# Patient Record
Sex: Female | Born: 1950 | Race: White | Marital: Married | Smoking: Never smoker
Health system: Northeastern US, Community
[De-identification: ages and names within clinical notes are randomized; demographics above are authoritative.]

## PROBLEM LIST (undated history)

## (undated) DIAGNOSIS — H8309 Labyrinthitis, unspecified ear: Secondary | ICD-10-CM

## (undated) DIAGNOSIS — I10 Essential (primary) hypertension: Secondary | ICD-10-CM

## (undated) HISTORY — PX: SUPRACERVICAL ABDL HYSTER W/WO RMVL TUBE OVARY: REP154

## (undated) HISTORY — PX: OB ANTEPARTUM CARE CESAREAN DLVR & POSTPARTUM: REP299

## (undated) HISTORY — DX: Essential (primary) hypertension: I10

## (undated) HISTORY — PX: MAMMAPLASTY AUGMENTATION W/PROSTHETIC IMPLANT: REP22

## (undated) HISTORY — DX: Labyrinthitis, unspecified ear: H83.09

---
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
## 2014-11-22 HISTORY — PX: MAMMAPLASTY AUGMENTATION W/PROSTHETIC IMPLANT: REP22

## 2017-12-15 ENCOUNTER — Telehealth (HOSPITAL_BASED_OUTPATIENT_CLINIC_OR_DEPARTMENT_OTHER): Payer: Self-pay | Admitting: Internal Medicine

## 2017-12-15 NOTE — Progress Notes (Unsigned)
nurse from [facility] contacted the [facility] to complete a benefit analysis for the tdap Vaccine.    The vaccine is covered under the patients prescription coverage.    Please choose  (Prior Auth)

## 2017-12-16 ENCOUNTER — Encounter (HOSPITAL_BASED_OUTPATIENT_CLINIC_OR_DEPARTMENT_OTHER): Payer: Self-pay | Admitting: Internal Medicine

## 2017-12-16 ENCOUNTER — Ambulatory Visit (HOSPITAL_BASED_OUTPATIENT_CLINIC_OR_DEPARTMENT_OTHER): Payer: No Typology Code available for payment source | Admitting: Internal Medicine

## 2017-12-16 VITALS — BP 105/69 | HR 86 | Temp 98.9°F | Ht 61.02 in | Wt 135.0 lb

## 2017-12-16 DIAGNOSIS — N951 Menopausal and female climacteric states: Secondary | ICD-10-CM | POA: Diagnosis not present

## 2017-12-16 DIAGNOSIS — I10 Essential (primary) hypertension: Secondary | ICD-10-CM | POA: Diagnosis not present

## 2017-12-16 DIAGNOSIS — Z0000 Encounter for general adult medical examination without abnormal findings: Secondary | ICD-10-CM | POA: Diagnosis not present

## 2017-12-16 DIAGNOSIS — E7800 Pure hypercholesterolemia, unspecified: Secondary | ICD-10-CM | POA: Diagnosis not present

## 2017-12-16 DIAGNOSIS — B009 Herpesviral infection, unspecified: Secondary | ICD-10-CM

## 2017-12-16 DIAGNOSIS — K219 Gastro-esophageal reflux disease without esophagitis: Secondary | ICD-10-CM

## 2017-12-16 DIAGNOSIS — H579 Unspecified disorder of eye and adnexa: Secondary | ICD-10-CM | POA: Diagnosis not present

## 2017-12-16 DIAGNOSIS — Z1231 Encounter for screening mammogram for malignant neoplasm of breast: Secondary | ICD-10-CM | POA: Diagnosis not present

## 2017-12-16 DIAGNOSIS — R0981 Nasal congestion: Secondary | ICD-10-CM | POA: Diagnosis not present

## 2017-12-16 DIAGNOSIS — Z1211 Encounter for screening for malignant neoplasm of colon: Secondary | ICD-10-CM | POA: Diagnosis not present

## 2017-12-16 DIAGNOSIS — Z1239 Encounter for other screening for malignant neoplasm of breast: Secondary | ICD-10-CM

## 2017-12-16 MED ORDER — SIMVASTATIN 20 MG PO TABS
20.0000 mg | ORAL_TABLET | Freq: Every evening | ORAL | 2 refills | Status: DC
Start: 2017-12-16 — End: 2018-06-13

## 2017-12-16 MED ORDER — ATENOLOL 50 MG PO TABS: 50 mg | tablet | Freq: Every day | ORAL | 2 refills | 0 days | Status: DC

## 2017-12-16 MED ORDER — OMEPRAZOLE 20 MG PO CPDR
20.00 mg | DELAYED_RELEASE_CAPSULE | Freq: Every day | ORAL | 2 refills | Status: AC
Start: 2017-12-16 — End: 2018-03-16

## 2017-12-16 MED ORDER — CETIRIZINE HCL 10 MG PO TABS: 10 mg | tablet | Freq: Every day | ORAL | 0 refills | 0 days | Status: AC

## 2017-12-16 MED ORDER — OMEPRAZOLE 20 MG PO CPDR: 20 mg | capsule | Freq: Every day | ORAL | 2 refills | 0 days | Status: AC

## 2017-12-16 MED ORDER — FLUTICASONE PROPIONATE 50 MCG/ACT NA SUSP
1.00 | Freq: Every day | NASAL | 3 refills | Status: AC
Start: 2017-12-16 — End: 2018-01-15

## 2017-12-16 MED ORDER — VENLAFAXINE HCL ER 37.5 MG PO CP24: 38 mg | capsule | Freq: Every day | ORAL | 2 refills | 0 days | Status: DC

## 2017-12-16 MED ORDER — VALACYCLOVIR HCL 500 MG PO TABS: tablet | ORAL | 1 refills | 0 days | Status: DC

## 2017-12-16 MED ORDER — VALACYCLOVIR HCL 500 MG PO TABS
ORAL_TABLET | ORAL | 1 refills | Status: DC
Start: 2017-12-16 — End: 2018-06-13

## 2017-12-16 MED ORDER — VENLAFAXINE HCL ER 37.5 MG PO CP24
37.5000 mg | ORAL_CAPSULE | Freq: Every day | ORAL | 2 refills | Status: DC
Start: 2017-12-16 — End: 2018-06-16

## 2017-12-16 MED ORDER — SIMVASTATIN 20 MG PO TABS: 20 mg | tablet | Freq: Every evening | ORAL | 2 refills | 0 days | Status: DC

## 2017-12-16 MED ORDER — ACYCLOVIR 5 % EX OINT
TOPICAL_OINTMENT | CUTANEOUS | 0 refills | Status: DC
Start: 2017-12-16 — End: 2019-09-25

## 2017-12-16 MED ORDER — CETIRIZINE HCL 10 MG PO TABS
10.00 mg | ORAL_TABLET | Freq: Every day | ORAL | 0 refills | Status: AC
Start: 2017-12-16 — End: 2017-12-23

## 2017-12-16 MED ORDER — ATENOLOL 50 MG PO TABS
50.0000 mg | ORAL_TABLET | Freq: Every day | ORAL | 2 refills | Status: DC
Start: 2017-12-16 — End: 2018-06-13

## 2017-12-16 MED ORDER — FLUTICASONE PROPIONATE 50 MCG/ACT NA SUSP: 1 | Bottle | Freq: Every day | NASAL | 3 refills | 0 days | Status: AC

## 2017-12-16 MED ORDER — ACYCLOVIR 5 % EX OINT: g | CUTANEOUS | 0 refills | 0 days | Status: AC

## 2017-12-16 MED FILL — *SIMVASTATIN 20MG: 90 days supply | Qty: 90 | Fill #0 | Status: CP

## 2017-12-16 MED FILL — VENLAFAXINE 37.5MG ER: 30 days supply | Qty: 30 | Fill #0 | Status: CP

## 2017-12-16 MED FILL — CETIRIZINE 10MG: 7 days supply | Qty: 7 | Fill #0 | Status: CP

## 2017-12-16 MED FILL — VALACYCLOVIR 500MG: 5 days supply | Qty: 20 | Fill #0 | Status: CP

## 2017-12-16 MED FILL — ATENOLOL   50MG: 90 days supply | Qty: 90 | Fill #0 | Status: CP

## 2017-12-16 MED FILL — *OMEPRAZOLE   20MG: 90 days supply | Qty: 90 | Fill #0 | Status: CP

## 2017-12-16 MED FILL — *FLUTICASONE SPR 50MCG: 30 days supply | Qty: 16 | Fill #0 | Status: CP

## 2017-12-16 MED FILL — ACYCLOVIR OIN 5%: 7 days supply | Qty: 15 | Fill #0 | Status: CP

## 2017-12-16 NOTE — Progress Notes (Signed)
Wendy Flores is a 67 year old female with the following Problems and Medications. Presenting to establish care.    There is no problem list on file for this patient.      No current outpatient medications on file.  No current facility-administered medications for this visit.   Review of Patient's Allergies indicates:  No Known Allergies    HM  Last mammogram was 2 years ago, will send for one.  Last pap was more than 2 years ago, pt will f/u for one.  Pt has never screened for colon cancer, she is interested in colonoscopy.  Wears glasses, reports weak rx and would like an eye exam.    Menopause  Pt is experiencing frequent hot flashes. She has previously taken hormones and is interested in starting again. She reports a calcification in her breast. Discussed trying low-dose effexor first, pt is interested.    High Cholesterol  Pt is currently taking simvastatin, will check lipid panel.    Hypertension  Pt is currently taking atenolol, would like a refill.    GERD  Pt is currently taking omeprazole, would like a refill.    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 She uses zovirax ointment w/ relief, would like a refill.    Nasal Congestion  Pt experiencing nasal congestion and congestion in right ear. Her nose burns at night. Hx of frequent ear infections.       Review of Systems   Constitutional: Negative.    HENT: Negative.    Eyes: Negative.    Respiratory: Negative.    Cardiovascular: Negative.    Gastrointestinal: Negative.    Genitourinary: Negative.    Musculoskeletal: Negative.    Skin: Negative.    Neurological: Negative.    Endo/Heme/Allergies: Negative.    Psychiatric/Behavioral: Negative.       O  BP 105/69  Pulse 86  Temp 98.9 F (37.2 C) (Temporal)  Ht 5' 1.02" (1.55 m)  Wt 61.2 kg (135 lb)  SpO2 95%  BMI 25.49 kg/m2  Physical Exam   Constitutional: She appears well-developed and well-nourished.   HENT:   Head: Normocephalic and atraumatic.    Edematous nasal turbinates. Mild bulging of TMs bilaterally.   Eyes: Pupils are equal, round, and reactive to light. EOM are normal.   Neck: Normal range of motion. Neck supple.   Pulmonary/Chest: Effort normal and breath sounds normal.   Musculoskeletal: Normal range of motion.   Neurological: She is alert. She has normal reflexes.   Skin: Skin is warm and dry.   Psychiatric: She has a normal mood and affect.     A/P  (Z12.31) Screening for malignant neoplasm of breast  Comment: Pt due for mammogram  Plan: DIGITAL SCREEN MAM WCAD&TOM          (Z00.00) Routine general medical examination at a health care facility  Comment: Currently taking simvastatin, rechecking lipid panel. Pt due for hep C screen.  Plan: LIPID PANEL, HEPATITIS C PCR QUAL TO QUANT    (Z12.11) Screening for colon cancer  Comment: Pt due for screen, is interested in colonoscopy.  Plan: REFERRAL TO OPEN ACCESS COLONOSCOPY    (I10) Essential hypertension  Comment: Currently taking atenolol, refill sent. Will order CMP.  Plan: atenolol (TENORMIN) 50 MG tablet, COMPREHENSIVE        METABOLIC PANEL          (N95.1) Hot flash, menopausal  Comment: Previously was on hormones and interested  in starting again. Discussed trialing effexor first, pt agreed.  Plan: venlafaxine (EFFEXOR-XR) 37.5 MG 24 hr capsule    (E78.00) High cholesterol  Comment: Taking simvastatin, would like a refill.  Plan: simvastatin (ZOCOR) 20 MG tablet    (K21.9) Mild acid reflux  Comment: Taking prilosec, would like a refill.  Plan: omeprazole (PRILOSEC) 20 MG capsule    (B00.9) Herpes  Comment: Reports relief from zovirax ointment, would like a refill. Interested in valtrex as well.  Plan: acyclovir (ZOVIRAX) 5 % ointment, valACYclovir         (VALTREX) 500 MG tablet    (H57.9) Abnormal vision screen  Comment: Wears glasses and reports weak rx.  Plan: REFERRAL TO OPHTHALMOLOGY ( INT)    (R09.81) Nasal congestion  Comment: Long-term nasal and ear congestion. Rxed zyrtec and  flonase.  Plan: cetirizine (ZYRTEC) 10 MG tablet, fluticasone         (FLONASE) 50 MCG/ACT nasal spray    We discussed the patients current medications. The patient expressed understanding and no barriers to adherence were identified.   1. The patient indicates understanding of these issues and agrees with the plan. Brief care plan is updated and reviewed with the patient.   2. The patient is given an After Visit Summary sheet that lists all medications with directions, allergies, orders placed during this encounter, and follow-up instructions.   3. I reviewed the patient's medical information and medical history   4. I reconciled the patient's medication list and prepared and supplied needed refills.   5. I have reviewed the past medical, family, and social history sections including the medications and allergies.    This documentation serves as a record of the services and decisions personally performed by Fortino SicJoseph Gleason Ardoin. It was created by Salome HolmesVera Menchikova (medical scribe) and was based on the provider's statements to me.         I attest that I have reviewed this note and that the components of the history of the present illness, the physical exam, and the assessment and plan documented were performed by me or were performed in my presence by the student and/or medical scribe and verified by me. This note is an accurate record of the services performed and decisions made by Fortino SicJoseph Natanael Saladin, PA-C.

## 2017-12-17 LAB — COMPREHENSIVE METABOLIC PANEL
ALANINE AMINOTRANSFERASE: 29 U/L (ref 12–45)
ALBUMIN: 4.2 g/dL (ref 3.4–5.0)
ALKALINE PHOSPHATASE: 74 U/L (ref 45–117)
ANION GAP: 6 mmol/L (ref 5–15)
ASPARTATE AMINOTRANSFERASE: 16 U/L (ref 8–34)
BILIRUBIN TOTAL: 0.3 mg/dL (ref 0.2–1.0)
BUN (UREA NITROGEN): 19 mg/dL — ABNORMAL HIGH (ref 7–18)
CALCIUM: 9.1 mg/dL (ref 8.5–10.1)
CARBON DIOXIDE: 31 mmol/L (ref 21–32)
CHLORIDE: 105 mmol/L (ref 98–107)
CREATININE: 0.9 mg/dL (ref 0.4–1.2)
ESTIMATED GLOMERULAR FILT RATE: >60 mL/min (ref >60)
Glucose Random: 149 mg/dL (ref 74–160)
POTASSIUM: 4.4 mmol/L (ref 3.5–5.1)
SODIUM: 142 mmol/L (ref 136–145)
TOTAL PROTEIN: 7 g/dL (ref 6.4–8.2)

## 2017-12-17 LAB — LIPID PANEL
Cholesterol: 170 mg/dL (ref 0–239)
HIGH DENSITY LIPOPROTEIN: 52 mg/dL (ref 40–?)
LOW DENSITY LIPOPROTEIN DIRECT: 80 mg/dL (ref 0–189)
TRIGLYCERIDES: 184 mg/dL — ABNORMAL HIGH (ref 0–150)

## 2017-12-17 LAB — HEPATITIS C PCR QUAL TO QUANT: HEPATITIS C ANTIBODY: NONREACTIVE

## 2017-12-19 ENCOUNTER — Other Ambulatory Visit (HOSPITAL_BASED_OUTPATIENT_CLINIC_OR_DEPARTMENT_OTHER): Payer: Self-pay

## 2017-12-19 ENCOUNTER — Encounter (HOSPITAL_BASED_OUTPATIENT_CLINIC_OR_DEPARTMENT_OTHER): Payer: Self-pay

## 2017-12-19 DIAGNOSIS — Z1211 Encounter for screening for malignant neoplasm of colon: Secondary | ICD-10-CM

## 2017-12-19 MED ORDER — PEG 3350-KCL-NA BICARB-NACL 420 G PO SOLR
ORAL | 0 refills | Status: AC
Start: 2017-12-19 — End: 2018-02-17

## 2017-12-19 MED ORDER — PEG 3350-KCL-NA BICARB-NACL 420 G PO SOLR: Bottle | ORAL | 0 refills | 0 days | Status: AC

## 2017-12-19 MED FILL — PEG-3350 SOL ELECTROL (GOLYTLY): 1 days supply | Qty: 4000 | Fill #0 | Status: CP

## 2017-12-19 NOTE — Telephone Encounter (Signed)
Spoke with patient regarding scheduling colonoscopy. Procedure/preparation/need for ride explained. Assessment completed. Patient scheduled for a colonoscopy screening with Dr Suzie PortelaPayne and moderate sedation on 01/12/18

## 2017-12-19 NOTE — Patient Instructions (Signed)
Preparação Padrão de Colonoscopia  Standard colonoscopy-Portuguese       Departamento de Gastroenterologia  [facility]  Telefone:__________________  Endereço: [address] [place], [place] [address]    POR FAVOR LEIA ATENCIOSAMENTE OU PEÇA A ALGUÉM PARA LER ISTO PARA VOCÊ!    ___________________________________________  Nome          Você fará uma colonoscopia.  Este exame permite o médico a examinar o interior do intestino grosso também chamado de cólon, usando uma pequena câmera em um tubo flexível.  Há várias razões para se fazer o exame:  • Rastreamento de cânceres e pólipos no cólon   • Verificar se há alguma alteração inexplicável nos hábitos intestinais  • Avaliar anormalidades indicadas em radiografias  • Examinar úlceras hemorrágicas ou outras anormalidades na mucosa do cólon                COMO SE PREPARAR PARA O EXAME    Sua receita foi enviada à farmácia ou entregue a você.    Por favor avie sua receita dentro de 1 semana após receber essas instruções.     SE VOCÊ ESTIVER USANDO NULYTELY OU UM REMÉDIO DESSE TIPO, NÃO ADICIONE ÁGUA AO REMÉDIO ATÉ 1 DIA ANTES DO EXAME.    Sua receita foi enviada para:______________________________________      Fale com um amigo ou um familiar para garantir que você   tenha um acompanhante para levá-lo de volta à casa.   Você deve ter um acompanhante para levá-lo de volta à casa após o exame ou não poderemos dar-lhe sedativos!       Se tiver alguma dúvida ou preocupações, ou se você não tem certeza de como se preparar para o exame, por favor telefone para _____________________            Algumas condições médicas exigem que você permança tomando a Aspirina.  Verifique com seu médico.  Importante: Não pare a aspirina até que você fale com o médico.                              NO DIA DO EXAME    De 4 a 5 horas antes do exame, tome a metade restante da garrafa do laxativo.  É muito importante terminar TODO O GALÃO.    Na manhã do exame, você pode tomar seus  outros medicamentos no horário habitual com um gole de água.  Estes incluem medicação de pressão arterial, anticonvulsivantes, medicação cardíaca, medicação da tiróide e etc)   .     Não tome a medicação da diabetes.  Traga esses medicamentos com você para que possa tomá-los logo após o exame.      NADA para beber por 3 hora antes do exame.      INFORMAÇÕES IMPORTANTES sobre sua medicação  Baseado Em Recomendações De Peritos    SE VOCÊ TIVER ALGUMA DÚVIDA SOBRE SUA MEDICAÇÃO, POR FAVOR CONVERSE COM SEU MÉDICO. NÃO ESPERE ATÉ O DIA ANTES DO EXAME!!!!!    Sua Medicação:  Você pode tomar a medicação para pressão alta, problemas cardíacos ou de ansiedade com um gole de água em seu horário normal.  Traga sua lista de medicamentos com você.     Se você tem Diabetes Tipo 2:     Um dia antes do exame. Se você toma pílulas de diabetes. Tome somente as PÍLULAS  da manhã. Se você toma insulina pela manhã: tome sua dose habitual       Na noite anterior ao exame: Não tome as pílulas da diabetes.  Se você toma insulina para diabetes tipo 2, tome metade de sua dose habitual de insulina de ação prolongada. Por exemplo: se você costuma tomar 40 unidades de Lantus ou insulina NPH,  tome 20 unidades em vez disso.    Na manhã do exame: Não tome as pílulas da diabetes. Se você toma insulina de ação prolongada para diabetes tipo 2, você pode tomar a metade da dose. Por exemplo: se toma 40 unidades de Lantus ou insulina NPH, tome 20 unidades em vez disso.     Após o exame: Você poderá voltar a se alimentar normalmente. . Nessa altura, você deve retornar ao horário habitual da medicação da diabetes . Tome sua dose habitual de insulina à noite. Se você não conseguir comer por qualquer motivo, verifique com o médico.    Se você tem Diabetes Tipo 1:    Um dia antes do exame. Tome sua dose habitual de insulina basal de ação prolongada  (Lantus ou NPH) e certifique-se de que os líquidos transparentes de sua  dieta contenham açúcar.. Verifique sua   glicose sanguínea na hora das refeições e somente se a glicose estiver acima de 200, use a insulina de ação rápida como cobertura. -De outra forma, nào tome a insulina de ação rápida    Na noite anterior ao exame: Tome sua dose habitual de insulina basal que você normalmente toma neste horário (por exemplo, sua dose completa habitual  de Lantus ou NPH).  Na manhã do exame: Tome sua dose habitual de insulina basal que você normalmente toma neste horário (por exemplo, sua dose completa habitual  de Lantus ou NPH).    Observe também ...    O Tylenol (Acetaminofeno) pode ser tomado para alívio de dor moderada.     Se você toma anticoagulantes sanguíneos (tais como Aspirina, Warfarina, Heparina, Coumadin ou Plavix) você deve perguntar ao médico ou enfermeiro antes de interromper estes medicamentos!!! Isso é importante se você toma esses medicamentos para doenças cardíacas, acidente vascular cerebral, embolia pulmonar, válvulas cardíacas artificiais, stent cardíaco ou vascular ou qualquer outro problema que poderiam levar a uma grave formação de coágulo, acidente vascular cerebral ou ataque cardíaco.          Lista de Verificação do Exame      Por favor, use esta lista para preparar-se para o exame.     7 dias antes do exame (____/____/____)     PARE de tomar Motrin, Advil, Naprosyn, Aleve ou medicamentos deste tipo.    Continue a tomar todos os outros medicamentos receitados.     Pergunte ao médico se você deve continuar a Aspirina.  Às vezes é importante seguir tomando esta medicação.    3 dias antes do exame (____/____/____)    Pare de comer todos os alimentos que contêm sementes, cascas de frutas, castanhas ou ricos em fibra    2 dias antes do exame (____/____/____)     GARANTA QUE FOI ARRANJADO UM ACOMPANHANTE      JANTE antes das 19:00hs.  INICIE uma dieta líquida.  (Não coma nada sólido. Isso inclui alimentos como arroz, macarrão, pão e frutas).    1 dias antes do exame (____/____/____)     PREPARE o  laxativo.   COMECE a tomar o laxativo às 18:00.    BEBA ½ da garrafa do laxativo.     No dia do exame (____/____/____)      TERMINE o restante do laxativo 5 a 6   horas antes do exame.    NÃO beba nada por 3 horas antes do exame.      O QUE ESPERAR NO DIA DO EXAME    Colonoscopia    A colonoscopia é um procedimento usado para examinar a parte interna do cólon e do reto.  Durante uma colonoscopia um tubo flexível com uma câmera e uma luz é inserido através do reto. O médico em seguida examina o intestino grosso (também chamado de cólon).    A colonoscopia pode detectar tecidos inflamados, úlceras e tumores anormais chamados pólipos. Alguns pólipos são malignos, mas a maioria são "pré-cancerosos". Estes podem tornar-se perigosos um dia. O médico geralmente pode remover esses pólipos durante o exame. Eles então são enviados ao laboratório para verificação. Os pólipos são comuns em adultos e geralmente são inofensivos. No entanto, a maioria dos cânceres colorretais se iniciam como pólipos. Removê-los é uma boa forma de prevenir o câncer. O médico talvez retire algumas biópsias (pequenos fragmentos de tecido) para análise de anormalidades que ele queira verificar mais detalhadamente.    É extremamente importante seguir todos os passos da limpeza do cólon .  Se você não seguir todos os passos, o médico pode não conseguir examinar com clareza. O exame poderá ser anulado e repetido outro dia. Os líquidos transparentes que você pode tomar durante a limpeza são encarados como alimento por ser organismo, por isso você não passará fome nem ficará desidratado.        Preparação No Hospital    No dia do exame, um enfermeiro irá pedir-lhe algumas informações sobre seu histórico médico.  Você vestirá um roupão do hospital. Antes do médico iniciar o procedimento, uma pequena agulha intravenosa será inserida na parte de trás de sua mão ou em seu antebraço para administrar os medicamentos que o deixarão confortável durante o  exame.    Na sala do procedimento, você será solicitado a deitar-se do lado esquerdo, em uma posição fetal.  Por favor, informe ao médico se esta posição for desconfortável para você. Você receberá oxigênio para respirar. O procedimento leva de 30 minutos à1 hora. Pode ser mais longo, se for necessário a remoção de pólipos ou se forem notadas outras anormalidades.      Você receberá medicação através da IV para controlar o desconforto e ajudá-lo a relaxar.  Você talvez durma ou fique parcialmente acordado durante o exame. Às vezes, você talvez sinta uma sensação de cólica. Estaremos monitorando e tratando de deixá-lo o mais confortável possível. O tubo será inserido no reto (parte de trás) e avançado através do intestino grosso.  O médico tentará examinar todas as paredes internas de seu cólon. A parede externa e os órgãos fora do cólon não são visíveis neste exame.    Possíveis Complicações    As complicações são raras durante ou após o exame, porém podem ocorrer. Os riscos mais comuns incluem perfuração intestinal (um rompimento no cólon) sangramento, problemas respiratórios, problemas de pressão arterial, problemas cardíacos, desconforto e reações adversas à medicação utilizada.  Uma perfuração pode resultar na necessidade de uma cirurgia de emergência e uma bolsa de colostomia. Esteja ciente também que a colonoscopia, como outros exames médicos, não é perfeita. Ela pode falhar em ver problemas como pólipos ou outras doenças de 2% a 6% das vezes. Felizmente, os riscos  de todos esses problemas combinados é pequeno.        Após o exame:    Você talvez sinta-se inchado devido ao ar que foi colocado no cólon durante o exame. Você   talvez sinta-se um pouco sonolento devido à medicação. Pelo resto do dia, você não poderá operar maquinário pesado (como um carro) nem fazer nenhum trabalho importante. Você deve planejar repousar, assistir televisão ou fazer uma leitura fácil após o exame. Você talvez esqueça de tudo o que  aconteceu durante o exame e imediatamente após o exame . É importante ter alguém com você que possa lembrá-lo de todas as instruções que dermos.    Dependendo do que for encontrado, você talvez precise repetir a colonoscopia. Geralmente, ela só é repetida vários anos mais tarde, mas talvez seja necessário repetí-la mais cedo. Fale com seu médico sobre quando você deverá repetir o exame ou outros testes.    De uma forma geral, você ficará no hospital de 2 à 3 horas (apesar de algumas vezes levar mais tempo).  Garantiremos que você esteja se sentindo muito bem antes da alta. Você deve ter um acompanhante que o leve de volta à casa após o exame.  Mais uma vez, não realizaremos o exame se você não tiver um acompanhante para levá-lo de volta à casa. Você não poderá ir para casa de táxi nem de ônibus.    A colonoscopia é um exame seguro e eficaz que é normalmente realizado em nossas instalações. Você talvez receba um telefonema de lembrete com a data e a hora de seu exame. Se tiver alguma pergunta, por favor sinta-se à vontade para telefonar:     Para perguntas sobre o próprio exame, telefone no [phone].    Para perguntas sobre a data e a hora de seu exame, ou para alterar a data ou a hora, telefone no [phone].    Para perguntas sobre sua medicação de rotina ou problemas médicos, por favor, telefone para seu médico.    Verbal instructions given over phone. Written instructions sent to patient in mail. Script sent to pharmacy.

## 2017-12-20 ENCOUNTER — Ambulatory Visit (HOSPITAL_BASED_OUTPATIENT_CLINIC_OR_DEPARTMENT_OTHER): Payer: No Typology Code available for payment source | Admitting: Clinic/Center

## 2017-12-20 ENCOUNTER — Other Ambulatory Visit (HOSPITAL_BASED_OUTPATIENT_CLINIC_OR_DEPARTMENT_OTHER): Payer: Self-pay | Admitting: Internal Medicine

## 2017-12-20 DIAGNOSIS — Z23 Encounter for immunization: Secondary | ICD-10-CM | POA: Diagnosis not present

## 2017-12-20 DIAGNOSIS — K219 Gastro-esophageal reflux disease without esophagitis: Secondary | ICD-10-CM

## 2017-12-20 MED ORDER — FAMOTIDINE 20 MG PO TABS
20.00 mg | ORAL_TABLET | Freq: Two times a day (BID) | ORAL | 2 refills | Status: AC
Start: 2017-12-20 — End: 2018-03-20

## 2017-12-20 MED ORDER — FAMOTIDINE 20 MG PO TABS: 20 mg | tablet | Freq: Two times a day (BID) | ORAL | 2 refills | 0 days | Status: AC

## 2017-12-20 NOTE — Progress Notes (Signed)
Visit with the assistance of the [place] telephone interpreter       Influenza Vaccine Procedure  December 20, 2017    1. Has the patient received the information for the influenza vaccine? Yes    2. Does the patient have any of the following contraindications?  Allergy to eggs? No  Allergic reaction to previous influenza vaccines? No  Any other problems to previous influenza vaccines? No  Paralyzed by Guillain-Barre syndrome?  No  Current moderate or severe illness? No  Allergy to contact lens solution? No    3. The vaccine has been administered in the usual fashion.     Immunization information reviewed. Current VIS reviewed and given to patient/ guardian. Verbal assent obtained from patient/ guardian.  See immunization/Injection module or chart review for date of publication and additional information. Verbal assent obtained from patient/guardian. Comfort measures for possible side effects reviewed.

## 2017-12-21 ENCOUNTER — Ambulatory Visit (HOSPITAL_BASED_OUTPATIENT_CLINIC_OR_DEPARTMENT_OTHER): Payer: Self-pay | Admitting: Internal Medicine

## 2017-12-21 DIAGNOSIS — Z1239 Encounter for other screening for malignant neoplasm of breast: Secondary | ICD-10-CM

## 2017-12-22 DIAGNOSIS — Z1231 Encounter for screening mammogram for malignant neoplasm of breast: Secondary | ICD-10-CM | POA: Diagnosis not present

## 2017-12-22 DIAGNOSIS — R921 Mammographic calcification found on diagnostic imaging of breast: Secondary | ICD-10-CM | POA: Diagnosis not present

## 2017-12-22 DIAGNOSIS — R922 Inconclusive mammogram: Secondary | ICD-10-CM | POA: Diagnosis not present

## 2017-12-22 NOTE — Addendum Note (Signed)
Addended by: Renne CriglerMALDONADO, MARISOL on: 12/22/2017 03:59 PM     Modules accepted: Orders

## 2017-12-27 ENCOUNTER — Encounter (HOSPITAL_BASED_OUTPATIENT_CLINIC_OR_DEPARTMENT_OTHER): Payer: Self-pay

## 2017-12-27 DIAGNOSIS — Z789 Other specified health status: Secondary | ICD-10-CM

## 2017-12-27 NOTE — Progress Notes (Signed)
.   Pt signed HCP

## 2018-01-05 ENCOUNTER — Other Ambulatory Visit (HOSPITAL_BASED_OUTPATIENT_CLINIC_OR_DEPARTMENT_OTHER): Payer: Self-pay

## 2018-01-05 NOTE — Telephone Encounter (Signed)
Pt is due for ifob, letter was sent. If pt call back please verify address so we can mail out ifob kit

## 2018-01-12 ENCOUNTER — Encounter (HOSPITAL_BASED_OUTPATIENT_CLINIC_OR_DEPARTMENT_OTHER): Payer: Self-pay

## 2018-02-09 LAB — MA SCREENING MAMMO BILATERAL DIGITAL WITH DBT & CAD

## 2018-02-16 ENCOUNTER — Telehealth (HOSPITAL_BASED_OUTPATIENT_CLINIC_OR_DEPARTMENT_OTHER): Payer: Self-pay

## 2018-02-16 ENCOUNTER — Encounter (HOSPITAL_BASED_OUTPATIENT_CLINIC_OR_DEPARTMENT_OTHER): Payer: Self-pay

## 2018-02-16 NOTE — Progress Notes (Signed)
Spoke with patient's dgt to schedule a screening colonoscopy.  Procedure/preparation and need for ride explained.  Medical history reviewed.  Patient scheduled for colonoscopy with CS with Dr Suzie PortelaPayne on 05/02/18 [facility]  Nulytely not prescribed, pateient has from missed procedure in Feb.      Sherolyn BubaPreparao Padro de Colonoscopia  Standard colonoscopy-Portuguese       [facility] de [facility]  [facility]  Telefone:__________________  Gardiner CoinsEndereo: [address] [place], [place] [id number]    POR FAVOR LEIA ATENCIOSAMENTE OU PEA A ALGUM PARA LER ISTO PARA VOC!    ___________________________________________  [place]          Voc far uma colonoscopia.  Este exame permite o mdico a examinar o interior do intestino grosso tambm chamado de clon, usando uma pequena cmera em um tubo flexvel.  H vrias razes para se fazer o exame:   Rastreamento de cnceres e plipos no clon    Verificar se h alguma alterao inexplicvel nos hbitos intestinais   Avaliar anormalidades indicadas em radiografias   Examinar lceras hemorrgicas ou outras anormalidades na mucosa do clon                COMO SE PREPARAR PARA O EXAME    Sua receita foi enviada  farmcia ou entregue a voc.    Por favor avie sua receita dentro de 1 semana aps receber essas instrues.     SE VOC ESTIVER USANDO NULYTELY OU UM REMDIO DESSE TIPO, NO ADICIONE GUA AO REMDIO AT 1 DIA ANTES DO EXAME.    Sua receita foi enviada para:______________________________________      Fale com um amigo ou um familiar para garantir que voc   tenha um acompanhante para lev-lo de Hewlett-Packardvolta  casa.   Voc deve ter um acompanhante para lev-lo de volta  casa aps o exame ou no poderemos dar-lhe sedativos!       Se tiver alguma dvida ou preocupaes, ou se voc no tem certeza de como se preparar para o exame, por favor telefone para _____________________            Algumas condies mdicas exigem que voc permana tomando a Aspirina.   Verifique com seu mdico.  Importante: No pare a aspirina at Hershey Companyque voc fale com o mdico.                              NO DIA DO EXAME    De 4 a 5 horas antes do exame, tome a metade restante da garrafa do laxativo.   muito importante terminar TODO O GALO.    Na manh do exame, voc pode tomar seus outros medicamentos no horrio habitual com um gole de gua.  Estes incluem medicao de presso arterial, anticonvulsivantes, medicao cardaca, medicao da tiride e etc)   .     No tome a medicao da diabetes.  Traga esses medicamentos com voc para que possa tom-los logo aps o exame.      [place] para beber por 3 hora antes do exame.      INFORMAES IMPORTANTES sobre sua medicao  Baseado Em Recomendaes De Peritos    SE VOC TIVER ALGUMA DVIDA SOBRE SUA MEDICAO, POR FAVOR CONVERSE COM SEU MDICO. NO ESPERE AT O DIA ANTES DO EXAME!!!!!    Sua Medicao:  Voc pode tomar a medicao para presso alta, problemas cardacos ou de ansiedade com um gole de gua em seu horrio normal.  Traga sua lista de medicamentos com voc.     Se voc tem Diabetes Tipo 2:     Um dia antes do exame. Se voc toma plulas de diabetes. Tome somente as Photographer. Se voc toma insulina pela manh: tome sua dose habitual     Na noite anterior ao exame: No tome as plulas da diabetes.  Se voc toma insulina para diabetes tipo 2, tome metade de [place] dose habitual de insulina de [employer]. Por exemplo: se voc costuma tomar 40 unidades de Lantus ou insulina NPH,  tome 20 unidades em vez disso.    Na manh do exame: No tome as plulas da diabetes. Se voc toma insulina de ao prolongada para diabetes tipo 2, voc pode tomar a Insurance claims handler dose. Por exemplo: se toma 40 unidades de Lantus ou insulina NPH, tome 20 unidades em vez disso.     Aps o exame: Voc poder voltar a se alimentar normalmente. Carmelina Noun altura, voc deve retornar ao horrio habitual da [employer] diabetes . Tome sua dose habitual de insulina  noite.  Se voc no conseguir comer por qualquer motivo, verifique com o mdico.    Se voc tem Diabetes Tipo 1:    Um dia antes do exame. Tome sua dose habitual de insulina basal de ao prolongada  (Lantus ou NPH) e certifique-se de que os lquidos transparentes de sua  dieta contenham acar.Oren Section sua  glicose sangunea na hora das refeies e somente se a Retail banker acima de 200, use a insulina de ao rpida [place]. Rush Barer forma, no tome a insulina de ao rpida    Na noite anterior ao exame: Tome sua dose habitual de insulina basal que voc normalmente toma neste horrio (por exemplo, sua dose completa habitual  de Lantus ou NPH).  Na manh do exame: Tome sua dose habitual de insulina basal que voc normalmente toma neste horrio (por exemplo, sua dose completa habitual  de Lantus ou NPH).    Observe tambm ...    O Tylenol (Acetaminofeno) pode ser tomado para alvio de dor moderada.     Se voc toma anticoagulantes sanguneos (tais como Aspirina, Maybee, Heparina, Coumadin ou Plavix) voc deve perguntar ao mdico ou enfermeiro antes de interromper estes medicamentos!!! Isso  importante se voc toma esses medicamentos para doenas cardacas, acidente vascular cerebral, embolia pulmonar, vlvulas cardacas artificiais, stent cardaco ou vascular ou qualquer outro problema que poderiam levar a uma grave formao de cogulo, acidente vascular cerebral ou ataque cardaco.          Lista de Verificao do Exame      Por favor, use esta lista para preparar-se para o exame.     7 dias antes do exame (____/____/____)     PARE de tomar Motrin, Advil, Naprosyn, Aleve ou medicamentos deste tipo.    Continue a tomar todos os outros medicamentos receitados.     Pergunte ao mdico se voc deve continuar a Aspirina.  s vezes  importante seguir tomando esta medicao.    3 dias antes do exame (____/____/____)    Tyler Aas de comer todos os alimentos que contm sementes, cascas de frutas, castanhas ou ricos em  fibra    2 dias antes do exame (____/____/____)     Lavone Orn QUE Clovia Cuff UM ACOMPANHANTE      JANTE antes das 19:00hs.  INICIE uma dieta lquida.  (No coma nada slido. Isso inclui alimentos como arroz, [place], po e frutas).  1 dias antes do exame (____/____/____)     PREPARE o laxativo.   COMECE a tomar o laxativo s 18:00.    [place] do laxativo.     No dia do exame (____/____/____)      Hinda Lenis o restante do laxativo 5 a 6 horas antes do exame.    NO beba nada por 3 horas antes do exame.      O QUE ESPERAR NO DIA DO EXAME    Colonoscopia    A colonoscopia  um procedimento usado para examinar a parte interna do clon e do reto.  Durante uma colonoscopia um tubo flexvel com uma cmera e uma luz  inserido atravs do reto. O mdico em seguida examina o intestino grosso (tambm chamado de clon).    A colonoscopia pode detectar tecidos inflamados, lceras e tumores anormais chamados plipos. Alguns plipos so malignos, mas a maioria so "pr-cancerosos". Estes podem tornar-se perigosos um dia. O mdico geralmente pode remover esses plipos durante o exame. Eles ento so enviados ao laboratrio para verificao. Os plipos so comuns em adultos e geralmente so inofensivos. No entanto, a maioria dos cnceres colorretais se iniciam como plipos. Remov-los  uma boa forma de prevenir o cncer. O mdico talvez retire algumas bipsias (pequenos fragmentos de tecido) para Journalist, newspaper de anormalidades que ele [place] verificar mais detalhadamente.     extremamente importante seguir todos os passos da limpeza do clon .  Se voc no seguir todos os passos, o mdico pode no conseguir examinar com clareza. O exame poder ser anulado e repetido outro dia. Os lquidos transparentes que voc pode tomar durante a limpeza so encarados como alimento por ser organismo, por isso voc no passar fome nem ficar desidratado.        Preparao No Hospital    No dia do exame, um enfermeiro ir pedir-lhe algumas  informaes sobre seu histrico mdico.  Voc vestir um roupo do hospital. Antes do mdico iniciar o procedimento, uma pequena agulha intravenosa ser inserida na parte de trs de sua mo ou em seu antebrao para Architectural technologist os medicamentos que o deixaro confortvel durante o exame.    Na sala do procedimento, voc ser solicitado a deitar-se do lado esquerdo, em uma posio fetal.  Por favor, informe ao mdico se esta posio for desconfortvel para voc. Voc receber oxignio para respirar. O procedimento leva de 30 minutos 1 hora. Pode ser [employer], se for necessrio a remoo de plipos ou se forem notadas outras anormalidades.      Voc receber Karle Plumber da IV para controlar o desconforto e ajud-lo a Counselling psychologist.  Voc talvez durma ou fique parcialmente acordado durante o exame. s vezes, voc talvez sinta uma sensao de clica. Estaremos monitorando e tratando de deix-lo o mais confortvel possvel. O tubo ser inserido no reto (parte de trs) e Raford Pitcher do intestino grosso.  O mdico tentar examinar todas as paredes internas de seu clon. A parede externa e os rgos fora do clon no so visveis neste exame.    Possveis Complicaes    As complicaes so raras durante ou aps o exame, porm podem ocorrer. Os riscos mais comuns incluem perfurao intestinal (um rompimento no clon) sangramento, problemas respiratrios, problemas de presso arterial, problemas cardacos, desconforto e reaes adversas  medicao utilizada.  Uma perfurao pode resultar na necessidade de uma cirurgia de emergncia e uma bolsa de colostomia. Esteja ciente tambm que a colonoscopia, como outros exames mdicos, no  perfeita. Ela pode falhar em  ver problemas como plipos ou outras doenas de 2% a 6% das vezes. Felizmente, os riscos  de todos esses problemas combinados  pequeno.        Aps o exame:    Voc talvez sinta-se inchado devido ao ar que foi colocado no clon durante o exame. Voc talvez  sinta-se um pouco sonolento devido  medicao. Pelo resto do dia, voc no poder operar maquinrio pesado (como um carro) nem fazer nenhum trabalho importante. Voc deve planejar repousar, assistir televiso ou fazer uma leitura fcil aps o exame. Voc talvez esquea de tudo o que aconteceu durante o exame e imediatamente aps o exame .  importante ter algum com voc que possa lembr-lo de todas as instrues que dermos.    Dependendo do que for encontrado, voc talvez precise repetir a colonoscopia. Geralmente, ela s  repetida vrios anos mais tarde, mas talvez seja necessrio repet-la mais cedo. Fale com seu mdico sobre quando voc dever repetir o exame ou outros testes.    De uma forma geral, voc ficar no hospital de 2  3 horas (apesar de algumas vezes levar mais tempo).  Garantiremos que voc esteja se sentindo muito bem antes da alta. Voc deve ter um acompanhante que o leve de volta  casa aps o exame.  Mais uma vez, no realizaremos o exame se voc no tiver um acompanhante para lev-lo de Water engineer. Voc no poder ir para casa de txi nem de nibus.    A colonoscopia  um exame seguro e eficaz que  normalmente realizado em nossas instalaes. Voc talvez receba um telefonema de lembrete com a data e a hora de seu exame. Se tiver alguma pergunta, por favor sinta-se  vontade para telefonar:     Para perguntas sobre o prprio exame, telefone no [id number].    Para perguntas sobre a data e a hora de seu exame, ou para alterar a data ou a hora, telefone no [id number].    Para perguntas sobre sua medicao de rotina ou problemas mdicos, por favor, telefone para seu mdico.

## 2018-04-06 ENCOUNTER — Telehealth (HOSPITAL_BASED_OUTPATIENT_CLINIC_OR_DEPARTMENT_OTHER): Payer: Self-pay

## 2018-04-06 NOTE — Progress Notes (Signed)
Returned call after receiving voicemail, unable to reach, left message with OA [phone] call back number.

## 2018-04-20 ENCOUNTER — Ambulatory Visit (HOSPITAL_BASED_OUTPATIENT_CLINIC_OR_DEPARTMENT_OTHER): Payer: Self-pay | Admitting: Internal Medicine

## 2018-04-20 LAB — MA DIAGNOSTIC MAMMO UNILATERAL RIGHT WITH CAD

## 2018-04-20 LAB — MA US BREAST-AXILLA LEFT

## 2018-04-20 LAB — MA US BREAST-AXILLA RIGHT

## 2018-04-25 ENCOUNTER — Ambulatory Visit (HOSPITAL_BASED_OUTPATIENT_CLINIC_OR_DEPARTMENT_OTHER): Payer: Medicaid Other | Admitting: Optometrist

## 2018-04-25 DIAGNOSIS — H52223 Regular astigmatism, bilateral: Principal | ICD-10-CM

## 2018-04-25 DIAGNOSIS — H524 Presbyopia: Principal | ICD-10-CM

## 2018-04-25 DIAGNOSIS — H5203 Hypermetropia, bilateral: Secondary | ICD-10-CM | POA: Insufficient documentation

## 2018-04-25 NOTE — Progress Notes (Signed)
I had a pleasure of seeing 67 year old Wendy Flores at the [facility] on 04/25/2018.   The summary of findings of the comprehensive eye exam are following:    Assessment:    1. 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 Ocular health exam: unremarkable OU    Plan:    1. Patient does have to wear glasses.  Glasses should be worn full time.    2. Return to clinic in one year for full exam.

## 2018-04-25 NOTE — H&P (Signed)
Wendy Flores here for eye exam. Complaints of blurry vision for distance and near with glasses for months. No other complaints. History of Hyperopia both eye .  Wendy Flores appears calm and alert.

## 2018-05-02 ENCOUNTER — Encounter (HOSPITAL_BASED_OUTPATIENT_CLINIC_OR_DEPARTMENT_OTHER): Payer: Self-pay | Admitting: Gastroenterology

## 2018-05-02 ENCOUNTER — Ambulatory Visit (HOSPITAL_BASED_OUTPATIENT_CLINIC_OR_DEPARTMENT_OTHER)
Admit: 2018-05-02 | Disposition: A | Discharge status: Home / Self Care | Payer: Self-pay | Source: Ambulatory Visit | Attending: Gastroenterology | Admitting: Gastroenterology

## 2018-05-02 DIAGNOSIS — K635 Polyp of colon: Secondary | ICD-10-CM

## 2018-05-02 DIAGNOSIS — K5730 Diverticulosis of large intestine without perforation or abscess without bleeding: Secondary | ICD-10-CM

## 2018-05-02 MED ORDER — FENTANYL CITRATE 0.05 MG/ML IJ SOLN
Freq: Once | INTRAMUSCULAR | Status: AC | PRN
Start: 2018-05-02 — End: 2018-05-02
  Administered 2018-05-02: 25 ug via INTRAVENOUS

## 2018-05-02 MED ORDER — MIDAZOLAM HCL 2 MG/2ML IJ SOLN
Freq: Once | INTRAMUSCULAR | Status: AC | PRN
Start: 2018-05-02 — End: 2018-05-02
  Administered 2018-05-02: 2 mg via INTRAVENOUS

## 2018-05-02 MED ORDER — MIDAZOLAM HCL 2 MG/2ML IJ SOLN
Freq: Once | INTRAMUSCULAR | Status: AC | PRN
Start: 2018-05-02 — End: 2018-05-02
  Administered 2018-05-02: 1 mg via INTRAVENOUS

## 2018-05-02 MED ORDER — MIDAZOLAM HCL 5 MG/5ML IJ SOLN
INTRAMUSCULAR | Status: DC
Start: 2018-05-02 — End: 2018-05-02
  Filled 2018-05-02: qty 10

## 2018-05-02 MED ORDER — FENTANYL CITRATE 0.05 MG/ML IJ SOLN
INTRAMUSCULAR | Status: DC
Start: 2018-05-02 — End: 2018-05-02
  Filled 2018-05-02: qty 4

## 2018-05-02 MED ORDER — SODIUM CHLORIDE 0.9 % IV SOLN
INTRAVENOUS | Status: DC
Start: 2018-05-02 — End: 2018-05-02
  Administered 2018-05-02: 08:00:00 via INTRAVENOUS

## 2018-05-02 NOTE — PROVATION-GI (Signed)
[facility]  Patient Name: Wendy Flores  MRN: [id number]  Account Number: [id number]  Gender: Female  Age: 67  Date of Birth: 1951-06-27  Admit Type: Outpatient  Patient Location: [facility]  Note Status: Finalized  Multiple Proc?: No  Preparation: Go Cherylann Banas  Referring MD:         Selena Lesser. Manuela Neptune, Md  Procedure Date:       05/02/2018 7:35:50 AM  Procedure:            Colonoscopy  Endoscopist:          Vernie Murders, MD  Indications for Procedure:       Screening for colorectal malignant neoplasm  Medications:          Midazolam 5 mg IV, Fentanyl 100 micrograms IV  Procedure:       Just prior to the procedure, an updated history and physical was done. I        obtained an informed consent from the patient reviewing the risk of the        procedure including (but not limited to) respiratory depression, perforation,        bleeding, discomfort, a possible need for surgery and unexpected reactions to        medications. The patient is aware that test has limitations and may not        detect significant lesions such as cancer or other potential diseases. The        patient was also informed that they might need a repeat colonoscopy earlier        than standard guidelines if there are changes in their symptoms or concerning        findings noted. A time out was performed with the entire procedure staff        present. The scope was passed under direct vision. Throughout the procedure,        the patient's blood pressure, pulse, and oxygen saturations were monitored        continuously. The [id number] was introduced through the anus and        advanced to 7 cm into the ileum. The scope was then slowly withdrawn with        confirmation of the noted findings. The colonoscopy was performed without        difficulty. The patient tolerated the procedure well. The quality of the        bowel preparation was good. Scope withdrawal time was 14 minutes. The total        duration of the  procedure was 21 minutes.  Findings:       The perianal and digital rectal examinations were normal. Pertinent negatives        include normal sphincter tone.       Many small and large-mouthed diverticula were found in the sigmoid colon,        descending colon and splenic flexure. There was narrowing of the colon in        association with the diverticular opening. There was no evidence of        diverticular bleeding.       The terminal ileum appeared normal.       A 10 mm polyp was found in the descending colon. The polyp was sessile. The        polyp was removed with a hot snare. Resection and retrieval were complete.        Estimated blood loss:  none.       Two sessile polyps were found in the sigmoid colon. The polyps were 4 to 10        mm in size. These polyps were removed with a hot snare. Resection and        retrieval were complete. Estimated blood loss: none.       The retroflexed view of the distal rectum and anal verge was normal and        showed no anal or rectal abnormalities.       The exam was otherwise without abnormality.  Post Procedure Diagnosis:       - Moderate diverticulosis in the sigmoid colon, in the descending colon and        at the splenic flexure. There was narrowing of the colon in association with        the diverticular opening. There was no evidence of diverticular bleeding.       - The examined portion of the ileum was normal.       - One 10 mm polyp in the descending colon, removed with a hot snare. Resected        and retrieved.       - Two 4 to 10 mm polyps in the sigmoid colon, removed with a hot snare.        Resected and retrieved.       - The distal rectum and anal verge are normal on retroflexion view.       - The examination was otherwise normal.  Complications:        No immediate complications. Estimated blood loss: None.  Estimated Blood Loss: Estimated blood loss: none.  Recommendation:       - Discharge patient to home (ambulatory).       - Await pathology  results.       - If the pathology report reveals adenomatous tissue, then repeat the        colonoscopy for surveillance in 3 years.       - If the pathology report indicates hyperplastic polyp, then repeat        colonoscopy for screening purposes in 10 years.       - Return to referring physician as previously scheduled.  Vernie MurdersMICHAEL C Maiyah Goyne, MD  05/02/2018 8:27:51 AM  This report has been signed electronically.  Number of Addenda: 0  Note Initiated On: 05/02/2018 7:35 AM

## 2018-05-02 NOTE — H&P (Addendum)
This 67 year old year old [place] speaking patient presents for consideration of a screening colonoscopy. The patient was seen with the help of the phone translator.    The patient denied any symptoms.  She has had no melena, hematochezia, abdominal pain or weight loss. She has no family history of colon cancer.      Past Medical History:  No date: HTN (hypertension)  No date: Labyrinthitis      Review of Patient's Allergies indicates:  No Known Allergies       Social History     Socioeconomic History    Marital status: Married     Spouse name: Not on file    Number of children: Not on file    Years of education: Not on file    Highest education level: Not on file   Occupational History    Not on file   Social Needs    Financial resource strain: Not on file    Food insecurity:     Worry: Not on file     Inability: Not on file    Transportation needs:     Medical: Not on file     Non-medical: Not on file   Tobacco Use    Smoking status: Never Smoker    Smokeless tobacco: Never Used   Substance and Sexual Activity    Alcohol use: Yes     Comment: Occasionally    Drug use: Never    Sexual activity: Not on file   Lifestyle    Physical activity:     Days per week: Not on file     Minutes per session: Not on file    Stress: Not on file   Relationships    Social connections:     Talks on phone: Not on file     Gets together: Not on file     Attends religious service: Not on file     Active member of club or organization: Not on file     Attends meetings of clubs or organizations: Not on file     Relationship status: Not on file    Intimate partner violence:     Fear of current or ex partner: Not on file     Emotionally abused: Not on file     Physically abused: Not on file     Forced sexual activity: Not on file   Other Topics Concern    Not on file   Social History Narrative    12/16/[birth year] She [place] in [place], [place] with her daughter. Originally from [place].          Current Outpatient  Medications   Medication Sig    atenolol (TENORMIN) 50 MG tablet Take 1 tablet by mouth daily    simvastatin (ZOCOR) 20 MG tablet Take 1 tablet by mouth nightly    venlafaxine (EFFEXOR-XR) 37.5 MG 24 hr capsule Take 1 capsule by mouth daily    valACYclovir (VALTREX) 500 MG tablet Take 2 pills at the start of outbreak and 2 pills 12 hours later     Current Facility-Administered Medications   Medication    midazolam (VERSED) 5 MG/5ML injection    fentaNYL (SUBLIMAZE) 0.05 MG/ML injection    sodium chloride 0.9% infusion       Family Hx  History reviewed.  No family history of colon cancer      REVIEW OF SYSTEMS:    Cardiovascular:  No chest pain, palpitations, MI or heart failure. Has HTN  Pulmonary:  No pneumonia, TB or asthma.   She denies sleep apnea.  Neuro:  No stroke, seizure or loss of consciousness.    Endocrine:  No diabetes or thyroid disease.      Physical Exam:  Vital Signs:  BP 143/69  Pulse 70  Temp 98.4 F (36.9 C)  Resp 12  SpO2 100%  General: Overweight body habitus.   HEENT:Normal pharynx. Mallampati score I. Able to open mouth wide. No loose teeth. No dentures.  Pulmonary: Clear to auscultation and percussion. No rales, wheezes or rhonchi.  Cardiovascular: S1, S2, no S3, S4, clicks, rubs or murmurs.  JVD not elevated with patient sitting.  Abdominal: Schapoid abdomen. Normal bowel sounds. No tenderness on light or deep palpation. No hepatomegaly or splenomegaly.    ASA II    Impression:  Screening colonoscopy    Medical Decision Making:  The patient will have a colonoscopy done. The patient was informed of the risk of bleeding, discomfort, perforation, a need for surgery, infection, drug reaction, cardiovascular and cerebrovascular compromise and the possibility of missing a lesion or problem.  She understood these risks and consented to the exam.     All questions were answered. The patient is in agreement with our plan.

## 2018-05-02 NOTE — Narrator Note (Signed)
Moderate Sedation    @SEDATIONTIMELINE@

## 2018-05-02 NOTE — Discharge Instructions (Signed)
[facility] DISCHARGE INSTRUCTIONS     Quando voc for para casa  possvel que se sinta sonolento(a). Descanse bastante pelo resto do dia.   When you return home you may feel sleepy. Get plenty of rest for the remainder of the day.     Se voc tiver recebido algum sedative para o procedimento NO DIRIJA, NO  MANUSEIE MQUINAS NEM TOME NENHUMA DECISO IMPORTANTE durante o resto do dia.  If you received sedation for your procedure DO NOT DRIVE, OPERATE MACHINERY, OR MAKE IMPORTANT DECISIONS for the remainder of the day.NO ALCOHOL TODAY.     Depois ter feito uma COLONOSCOPIA  normal ter gases e sentir inchao abdominal, mas se voc tiver uma DOR ABDOMINAL SEVERA entre em contato com o seu mdico imediatamente.   It is normal after having a COLONOSCOPY to feel a little gassy and bloated, but if you develop SEVERE ABDOMINAL PAIN call your doctor immediately.      Depois ter feito uma COLONOSCOPIA  normal ver uma pequena quantidade de sangue aps as primeiras evacuaes, mas se voc vir uma QUANTIDADE GRANDE DE SANGUE VERMELHO VIVO, entre em contato com o seu mdico imediatamente.  It is normal after having a COLONOSCOPY to see a small amount of blood after your first few bowel movements, but, if you see a LARGE AMOUNT OF BRIGHT RED BLOOD, call your doctor immediately.       Se tiver feito uma bipsia ou Polipectomia voc ter que suspender a aspirina ou os medicamentos que contenham aspirina por {3} dias.                  If you had a biopsy or Polypectomy you will need to hold aspirin or medication containing aspirin for __3_days.         Se voc tiver feito uma bipsia ou Polipectomia voc ter que suspender qualquer medicamento tais como Advil, Motrin, Naproxin, Ibuprofen etc por {3} dias.  If you had a biopsy or Polypectomy you will need to hold any medication such as Advil, Motrin, Naproxin, Ibuprofen etc for_3__days.MAY TAKE TYLENOL AS NEEDED FOR  PAIN/DISCOMFORT.     Entre em contato com o seu mdico se houver qualquer outro sintoma fora do normal.  Call you physician for any other unusual symptoms.     Se por qualquer razo voc no conseguir entrar em contato com o seu mdico v para a sala de [facility] mais prxima.   If for any reason you are unable to reach your doctor go to the nearest Emergency Room.    Instrues Especficas:  Specific InstructionPost Procedure Diagnosis:       - Moderate diverticulosis in the sigmoid colon, in the descending colon and        at the splenic flexure. There was narrowing of the colon in association with        the diverticular opening. There was no evidence of diverticular bleeding.       - The examined portion of the ileum was normal.       - One 10 mm polyp in the descending colon, removed with a hot snare. Resected        and retrieved.       - Two 4 to 10 mm polyps in the sigmoid colon, removed with a hot snare.        Resected and retrieved.       - The distal rectum and anal verge are normal on  retroflexion view.       - The examination was otherwise normal.  Complications:        No immediate complications. Estimated blood loss: None.  Estimated Blood Loss: Estimated blood loss: none.  Recommendation:       - Discharge patient to home (ambulatory).       - Await pathology results.       - If the pathology report reveals adenomatous tissue, then repeat the        colonoscopy for surveillance in 3 years.       - If the pathology report indicates hyperplastic polyp, then repeat        colonoscopy for screening purposes in 10 years.       - Return to referring physician as previously scheduled.  Wendy Murders, MD  05/02/2018 8:27:51 AM    s:

## 2018-05-04 LAB — SURGICAL PATH SPECIMEN GASTROINTESTINAL

## 2018-05-06 ENCOUNTER — Encounter (HOSPITAL_BASED_OUTPATIENT_CLINIC_OR_DEPARTMENT_OTHER): Payer: Self-pay | Admitting: Gastroenterology

## 2018-06-13 ENCOUNTER — Ambulatory Visit
Admission: RE | Admit: 2018-06-13 | Discharge: 2018-06-13 | Disposition: A | Discharge status: Home / Self Care | Payer: No Typology Code available for payment source | Attending: Internal Medicine | Admitting: Internal Medicine

## 2018-06-13 ENCOUNTER — Ambulatory Visit (HOSPITAL_BASED_OUTPATIENT_CLINIC_OR_DEPARTMENT_OTHER): Payer: No Typology Code available for payment source | Admitting: Internal Medicine

## 2018-06-13 ENCOUNTER — Encounter (HOSPITAL_BASED_OUTPATIENT_CLINIC_OR_DEPARTMENT_OTHER): Payer: Self-pay | Admitting: Internal Medicine

## 2018-06-13 VITALS — BP 125/71 | HR 70 | Temp 97.7°F | Ht 61.0 in | Wt 132.0 lb

## 2018-06-13 DIAGNOSIS — I10 Essential (primary) hypertension: Secondary | ICD-10-CM

## 2018-06-13 DIAGNOSIS — M25441 Effusion, right hand: Secondary | ICD-10-CM | POA: Diagnosis present

## 2018-06-13 DIAGNOSIS — M25541 Pain in joints of right hand: Secondary | ICD-10-CM | POA: Diagnosis not present

## 2018-06-13 DIAGNOSIS — M7989 Other specified soft tissue disorders: Secondary | ICD-10-CM | POA: Diagnosis not present

## 2018-06-13 DIAGNOSIS — E7800 Pure hypercholesterolemia, unspecified: Secondary | ICD-10-CM | POA: Diagnosis not present

## 2018-06-13 DIAGNOSIS — M79644 Pain in right finger(s): Secondary | ICD-10-CM | POA: Diagnosis not present

## 2018-06-13 DIAGNOSIS — B009 Herpesviral infection, unspecified: Secondary | ICD-10-CM

## 2018-06-13 DIAGNOSIS — M25841 Other specified joint disorders, right hand: Secondary | ICD-10-CM | POA: Diagnosis not present

## 2018-06-13 DIAGNOSIS — I519 Heart disease, unspecified: Secondary | ICD-10-CM | POA: Diagnosis not present

## 2018-06-13 MED ORDER — SIMVASTATIN 20 MG PO TABS
20.0000 mg | ORAL_TABLET | Freq: Every evening | ORAL | 2 refills | Status: DC
Start: 2018-06-13 — End: 2019-07-02

## 2018-06-13 MED ORDER — SIMVASTATIN 20 MG PO TABS: 20 mg | tablet | Freq: Every evening | ORAL | 2 refills | 0 days | Status: AC

## 2018-06-13 MED ORDER — LOSARTAN POTASSIUM 100 MG PO TABS
100.0000 mg | ORAL_TABLET | Freq: Every day | ORAL | 2 refills | Status: DC
Start: 2018-06-13 — End: 2018-08-14

## 2018-06-13 MED ORDER — SULFAMETHOXAZOLE-TRIMETHOPRIM 800-160 MG PO TABS: 1 | tablet | Freq: Two times a day (BID) | ORAL | 0 refills | 0 days | Status: AC

## 2018-06-13 MED ORDER — VALACYCLOVIR HCL 500 MG PO TABS: tablet | ORAL | 1 refills | 0 days | Status: AC

## 2018-06-13 MED ORDER — SULFAMETHOXAZOLE-TRIMETHOPRIM 800-160 MG PO TABS
1.00 | ORAL_TABLET | Freq: Two times a day (BID) | ORAL | 0 refills | Status: AC
Start: 2018-06-13 — End: 2018-06-20

## 2018-06-13 MED ORDER — LOSARTAN POTASSIUM 100 MG PO TABS: 100 mg | tablet | Freq: Every day | ORAL | 2 refills | 0 days | Status: DC

## 2018-06-13 MED ORDER — VALACYCLOVIR HCL 500 MG PO TABS
ORAL_TABLET | ORAL | 1 refills | Status: AC
Start: 2018-06-13 — End: 2019-06-13

## 2018-06-13 MED FILL — VALACYCLOVIR 500MG: 20 days supply | Qty: 20 | Fill #0 | Status: CP

## 2018-06-13 MED FILL — LOSARTAN POT 100MG: 30 days supply | Qty: 30 | Fill #0 | Status: CP

## 2018-06-13 MED FILL — *SIMVASTATIN 20MG: 30 days supply | Qty: 30 | Fill #0 | Status: CP

## 2018-06-13 MED FILL — SMZ/TMP DS  800-160: 7 days supply | Qty: 14 | Fill #0 | Status: CP

## 2018-06-13 NOTE — Progress Notes (Signed)
Wendy Flores is a 67 year old female with the following Problems and Medications. Presenting with hand pain.    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      Review of Patient's Allergies indicates:  No Known Allergies    Thumb Pain  Pt has been experiencing pain and warmth over right IP thumb joint the last week. Sx have been improving. No fever. Starting bactrim and sending for x-ray. Informed pt to f/u in 3 days.    HTN  Normal bp in office. Pt experiences abdominal discomfort after taking atenolol 50mg , She would like to start losartan instead, order placed.    High Cholesterol  Pt is currently taking 20mg  simvastatin and would like a refill.    Cardiac Disease  Pt was dxed w/ heart disease in [place], but has not established care w/ a cardiologist in the [place]. Referring to cardiology.    Herpes  Pt experiences herpes outbreak once a month when she has increased anxiety. Rxed valtrex.       Review of Systems   Constitutional: Negative.    HENT: Negative.    Eyes: Negative.    Respiratory: Negative.    Cardiovascular: Negative.    Gastrointestinal: Negative.    Genitourinary: Negative.    Musculoskeletal: Negative.    Skin: Negative.    Neurological: Negative.    Endo/Heme/Allergies: Negative.    Psychiatric/Behavioral: Negative.       O  BP 125/71  Pulse 70  Temp 97.7 F (36.5 C) (Temporal)  Ht 5\' 1"  (1.549 m)  Wt 59.9 kg (132 lb)  SpO2 98%  BMI 24.94 kg/m2  Physical Exam    Constitutional: She is oriented to person, place, and time. She appears well-developed and well-nourished.   HENT:   Head: Normocephalic and atraumatic.   Eyes: Pupils are equal, round, and reactive to light. EOM are normal.   Neck: Normal range of motion. Neck supple.   Pulmonary/Chest: Effort normal and breath sounds normal.   Musculoskeletal: Normal range of motion.   Neurological: She is alert and oriented to person, place, and time.   Skin: Skin is warm and dry.   Psychiatric: She has a normal mood and affect.     A/P  (M79.644) Thumb pain, right  (primary encounter diagnosis)  Comment: Warmth, swelling and erythema over right IP joint. No fever. Rxed bactrim. Sending for x-ray.  Plan: sulfamethoxazole-trimethoprim (BACTRIM DS)         800-160 MG per tablet, XR HAND RIGHT MINIMUM 3         VIEWS  F/u in 3 days.    (E78.00) High cholesterol  Comment: Currently taking simvastatin and would like a refill, order placed.  Plan: simvastatin (ZOCOR) 20 MG tablet    (B00.9) Herpes  Comment: Outbreak occurs over gluteal region when pt has increased anxiety. Currently having an  outbreak. Rxed valtrex.  Plan: valACYclovir (VALTREX) 500 MG tablet    (I51.9) Cardiac disease  Comment: Dxed in [place], but has not seen cardiology in [place]. Referring to cardiology.  Plan: REFERRAL TO CARDIOLOGY ( INT)    (I10) Essential hypertension  Comment: Normal bp in office. Experiences abdominal pain on amlodipine, would like to start losartan instead. Order placed.  Plan: losartan (COZAAR) 100 MG tablet    We discussed the patients current medications. The patient expressed understanding and no barriers to adherence were identified.   1. The patient indicates understanding of these issues and agrees with the plan. Brief care plan is updated and reviewed with the patient.   2. The patient is given an After Visit Summary sheet that lists all medications with directions, allergies, orders placed during this encounter, and follow-up  instructions.   3. I reviewed the patient's medical information and medical history   4. I reconciled the patient's medication list and prepared and supplied needed refills.   5. I have reviewed the past medical, family, and social history sections including the medications and allergies.    This documentation serves as a record of the services and decisions personally performed by Fortino SicJoseph Brenleigh Collet. It was created by Salome HolmesVera Menchikova (medical scribe) and was based on the provider's statements to me.         I attest that I have reviewed this note and that the components of the history of the present illness, the physical exam, and the assessment and plan documented were performed by me or were performed in my presence by the student and/or medical scribe and verified by me. This note is an accurate record of the services performed and decisions made by Fortino SicJoseph Aino Heckert, PA-C.

## 2018-06-14 DIAGNOSIS — M7989 Other specified soft tissue disorders: Secondary | ICD-10-CM

## 2018-06-15 ENCOUNTER — Telehealth (HOSPITAL_BASED_OUTPATIENT_CLINIC_OR_DEPARTMENT_OTHER): Payer: Self-pay | Admitting: Family Medicine

## 2018-06-15 NOTE — Progress Notes (Signed)
Nurse from [facility] called the Central Refill [facility] to complete a benefit analysis for the TDAP  & PCV-13 Vaccine.  Both vaccines are covered under the patients prescription coverage.     Please choose Prior Auth

## 2018-06-16 ENCOUNTER — Encounter (HOSPITAL_BASED_OUTPATIENT_CLINIC_OR_DEPARTMENT_OTHER): Payer: Self-pay | Admitting: Internal Medicine

## 2018-06-16 ENCOUNTER — Ambulatory Visit: Payer: No Typology Code available for payment source | Attending: Internal Medicine | Admitting: Internal Medicine

## 2018-06-16 VITALS — BP 106/65 | HR 102 | Temp 97.5°F | Wt 131.0 lb

## 2018-06-16 DIAGNOSIS — T148XXA Other injury of unspecified body region, initial encounter: Secondary | ICD-10-CM | POA: Diagnosis not present

## 2018-06-16 DIAGNOSIS — L03012 Cellulitis of left finger: Secondary | ICD-10-CM | POA: Diagnosis present

## 2018-06-16 DIAGNOSIS — N951 Menopausal and female climacteric states: Secondary | ICD-10-CM | POA: Diagnosis not present

## 2018-06-16 MED ORDER — VENLAFAXINE HCL ER 37.5 MG PO CP24: 38 mg | capsule | Freq: Every day | ORAL | 2 refills | 0 days | Status: DC

## 2018-06-16 MED ORDER — VENLAFAXINE HCL ER 37.5 MG PO CP24
37.5000 mg | ORAL_CAPSULE | Freq: Every day | ORAL | 2 refills | Status: DC
Start: 2018-06-16 — End: 2018-08-14

## 2018-06-16 MED FILL — VENLAFAXINE 37.5MG ER CAPS: 30 days supply | Qty: 30 | Fill #0 | Status: CP

## 2018-06-16 NOTE — Progress Notes (Signed)
Wendy HeckMarilene Lothrop is a 67 year old female with the following Problems and Medications. Presenting with cellulitis.    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      Review of Patient's Allergies indicates:  No Known Allergies     Cellulitis  Pt was seen 3 days ago for left thumb pain and was rxed bactrim. Informed pt that x-ray indicated mild swelling, but no infection in bone. She has mild pain and redness has significantly improved. Informed pt to finish abx course.    Puncture Wound  Pt accidentally punctured the skin on her left foot w/ a knife 2 days ago. Rec'd to apply ice to the area and to take tylenol to manage pain.    Hot Flash  Pt would like a refill of venlafaxine, order placed.       Review of Systems   Constitutional: Negative.    HENT: Negative.    Eyes: Negative.    Respiratory: Negative.    Cardiovascular: Negative.    Gastrointestinal: Negative.    Genitourinary: Negative.    Musculoskeletal: Negative.    Skin: Negative.    Neurological: Negative.    Endo/Heme/Allergies: Negative.    Psychiatric/Behavioral: Negative.       O  BP 106/65  Pulse 102  Temp 97.5 F (36.4 C) (Temporal)  Wt 59.4 kg (131 lb)  SpO2 98%  BMI 24.75 kg/m2   Physical Exam   Constitutional: She is oriented to person, place, and time. She appears well-developed and well-nourished.    HENT:   Head: Normocephalic and atraumatic.   Eyes: Pupils are equal, round, and reactive to light. EOM are normal.   Neck: Normal range of motion. Neck supple.   Pulmonary/Chest: Effort normal and breath sounds normal.   Musculoskeletal: Normal range of motion.   Neurological: She is alert and oriented to person, place, and time.   Skin: Skin is warm and dry.   Psychiatric: She has a normal mood and affect.     A/P  (N95.1) Hot flash, menopausal  Comment: Pt would like a refill, order placed.  Plan: venlafaxine (EFFEXOR-XR) 37.5 MG 24 hr capsule    (L03.012) Cellulitis of finger of left hand  Comment: Improved redness and pain in the last 3 days. Informed pt to finish abx course.  Plan: F/u as needed    (T14.8XXA) Puncture wound  Comment: Superficial wound on left foot. Rec'd to apply ice to area and to take tylenol to manage pain.  Plan: F/u as needed    We discussed the patients current medications. The patient expressed understanding and no barriers to adherence were identified.   1. The patient indicates understanding of these issues and agrees with the plan. Brief care  plan is updated and reviewed with the patient.   2. The patient is given an After Visit Summary sheet that lists all medications with directions, allergies, orders placed during this encounter, and follow-up instructions.   3. I reviewed the patient's medical information and medical history   4. I reconciled the patient's medication list and prepared and supplied needed refills.   5. I have reviewed the past medical, family, and social history sections including the medications and allergies.    This documentation serves as a record of the services and decisions personally performed by Fortino Sic. It was created by Salome Holmes (medical scribe) and was based on the provider's statements to me.         I attest that I have reviewed this note and that the components of the history of the present illness, the physical exam, and the  assessment and plan documented were performed by me or were performed in my presence by the student and/or medical scribe and verified by me. This note is an accurate record of the services performed and decisions made by Fortino Sic, PA-C.

## 2018-06-20 LAB — HEPATITIS C ANTIBODY CARE EVERYWHERE
HEPATITIS C ANTIBODY CARE EVERYWHERE: NONREACTIVE — NL
SIGNAL TO CUT OFF CARE EVERYWHERE: 0.01 — NL (ref <1.00)

## 2018-07-13 MED FILL — VENLAFAXINE 37.5MG ER CAPS: 30 days supply | Qty: 30 | Fill #1 | Status: CP

## 2018-07-13 MED FILL — LOSARTAN POT 100MG: 30 days supply | Qty: 30 | Fill #1 | Status: CP

## 2018-08-14 ENCOUNTER — Other Ambulatory Visit (HOSPITAL_BASED_OUTPATIENT_CLINIC_OR_DEPARTMENT_OTHER): Payer: Self-pay | Admitting: Family Medicine

## 2018-08-14 DIAGNOSIS — N951 Menopausal and female climacteric states: Secondary | ICD-10-CM

## 2018-08-14 DIAGNOSIS — I10 Essential (primary) hypertension: Secondary | ICD-10-CM

## 2018-08-14 MED ORDER — VENLAFAXINE HCL ER 37.5 MG PO CP24: 38 mg | capsule | Freq: Every day | ORAL | 1 refills | 0 days | Status: AC

## 2018-08-14 MED ORDER — LOSARTAN POTASSIUM 100 MG PO TABS
100.0000 mg | ORAL_TABLET | Freq: Every day | ORAL | 3 refills | Status: DC
Start: 2018-08-14 — End: 2019-08-31

## 2018-08-14 MED ORDER — VENLAFAXINE HCL ER 37.5 MG PO CP24
37.5000 mg | ORAL_CAPSULE | Freq: Every day | ORAL | 1 refills | Status: DC
Start: 2018-08-14 — End: 2019-02-22

## 2018-08-14 MED ORDER — LOSARTAN POTASSIUM 100 MG PO TABS: 100 mg | tablet | Freq: Every day | ORAL | 3 refills | 0 days | Status: AC

## 2018-08-14 MED FILL — VENLAFAXINE 37.5MG ER CAPS: 30 days supply | Qty: 30 | Fill #0 | Status: CP

## 2018-08-14 MED FILL — LOSARTAN POT 100MG: 30 days supply | Qty: 30 | Fill #0 | Status: CP

## 2018-08-14 NOTE — Progress Notes (Signed)
PER Daughter, Wendy HeckMarilene Flores is a 67 year old female has requested a refill of losartan and venlafaxine.      Last Office Visit: 06/16/2018 with Rene KocherEnglih,J  Last Physical Exam: 12/16/2017    There are no preventive care reminders to display for this patient.    Other Med Adult:  Most Recent BP Reading(s)  06/16/18 : 106/65        Cholesterol (mg/dL)   Date Value   16/10/960401/25/2019 170     LOW DENSITY LIPOPROTEIN DIRECT (mg/dL)   Date Value   54/09/811901/25/2019 80     HIGH DENSITY LIPOPROTEIN (mg/dL)   Date Value   14/78/295601/25/2019 52     TRIGLYCERIDES (mg/dL)   Date Value   21/30/865701/25/2019 184 (H)         No results found for: TSHSC      No results found for: TSH    No results found for: HGBA1C    No results found for: POCA1C      No results found for: INR    SODIUM (mmol/L)   Date Value   12/16/2017 142       POTASSIUM (mmol/L)   Date Value   12/16/2017 4.4           CREATININE (mg/dL)   Date Value   84/69/629501/25/2019 0.9       Documented patient preferred pharmacies:    [facility], [place] - [address]  Phone: [phone] Fax: [phone]

## 2018-09-13 ENCOUNTER — Ambulatory Visit (HOSPITAL_BASED_OUTPATIENT_CLINIC_OR_DEPARTMENT_OTHER): Payer: Self-pay | Admitting: Cardiovascular Disease

## 2018-09-18 MED FILL — LOSARTAN POT 100MG: 30 days supply | Qty: 30 | Fill #1 | Status: CP

## 2018-09-18 MED FILL — VENLAFAXINE 37.5MG ER: 30 days supply | Qty: 30 | Fill #1 | Status: CP

## 2018-10-20 ENCOUNTER — Other Ambulatory Visit (HOSPITAL_BASED_OUTPATIENT_CLINIC_OR_DEPARTMENT_OTHER): Payer: Self-pay | Admitting: Internal Medicine

## 2018-10-20 DIAGNOSIS — R928 Other abnormal and inconclusive findings on diagnostic imaging of breast: Secondary | ICD-10-CM

## 2018-10-25 MED FILL — LOSARTAN POT 100MG: 30 days supply | Qty: 30 | Fill #2 | Status: CP

## 2018-10-25 MED FILL — VENLAFAXINE 37.5MG ER CAPS: 30 days supply | Qty: 30 | Fill #2 | Status: CP

## 2018-10-26 ENCOUNTER — Ambulatory Visit (HOSPITAL_BASED_OUTPATIENT_CLINIC_OR_DEPARTMENT_OTHER): Payer: Self-pay

## 2018-10-26 ENCOUNTER — Other Ambulatory Visit (HOSPITAL_BASED_OUTPATIENT_CLINIC_OR_DEPARTMENT_OTHER): Payer: Self-pay

## 2018-10-26 ENCOUNTER — Ambulatory Visit (HOSPITAL_BASED_OUTPATIENT_CLINIC_OR_DEPARTMENT_OTHER): Payer: Self-pay | Admitting: Internal Medicine

## 2018-11-27 MED FILL — LOSARTAN POT 100MG: 30 days supply | Qty: 30 | Fill #3 | Status: CP

## 2018-11-27 MED FILL — VENLAFAXINE 37.5MG ER CAPS: 30 days supply | Qty: 30 | Fill #3 | Status: CP

## 2018-11-29 ENCOUNTER — Encounter (HOSPITAL_BASED_OUTPATIENT_CLINIC_OR_DEPARTMENT_OTHER): Payer: Self-pay | Admitting: Cardiovascular Disease

## 2018-11-29 ENCOUNTER — Ambulatory Visit: Payer: 59 | Attending: Internal Medicine | Admitting: Cardiovascular Disease

## 2018-11-29 VITALS — BP 120/76 | HR 72 | Ht 61.0 in | Wt 127.0 lb

## 2018-11-29 DIAGNOSIS — R42 Dizziness and giddiness: Secondary | ICD-10-CM | POA: Diagnosis present

## 2018-11-29 DIAGNOSIS — E785 Hyperlipidemia, unspecified: Secondary | ICD-10-CM | POA: Insufficient documentation

## 2018-11-29 DIAGNOSIS — I10 Essential (primary) hypertension: Secondary | ICD-10-CM | POA: Diagnosis not present

## 2018-11-29 NOTE — Progress Notes (Signed)
CARDIOLOGY CLINIC CONSULT NOTE  Date of visit: 11/29/2018  Language: [place]    History of present illness:   68 year old female with hypertension, hyperlipidemia, prior diagnosis of unspecified heart disease but still on consult for these issues.    Says she saw a non-cardiologist in [place] in summer 2019 and was suggested to see a cardiologist in the [place] but doesn't know why. No acute cardiac sxs.  No known cardiac dx other than htn or hl.  Remotely had stress tests that were normal.     Dx w HTN ~2015 in [place]. Reports having 24 hr bp monitor and being told BP was highest at night. Started on meds. Continued meds from [place] until established care at [facility] in 1/19. Was on atenolol until 7/19 then changed to losartan. Was due to possible GI s/e of atenolol. Thinks stomach is the same on atenolol and losartan so unlikely bp med related     In 7/19 seen at ER in [place] for high BP. Woke at night, didn't feel good. Ha and nausea. Took BP went from 160 to 170 to 180 etc. Went to ER. BP in er listed as 150/100. Told to increase losartan from 50 to 100.      Has home bp machine for arm at home but only checks when not feeling well.     No cp, sob, palps, cva sxs, pnd, bleeidng, swelling.  Doesn't exercise but no restrictions on activity.  Generally feels good. Does say a vein in her hand and veins in her legs bother her.        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     Colon polyp     Diverticulosis of colon      Allergies:  Review of Patient's Allergies indicates:  No Known Allergies    Meds:    Current Outpatient Medications on File Prior to Visit:  venlafaxine (EFFEXOR-XR) 37.5 MG 24 hr capsule Take 1 capsule by mouth daily Disp: 90 capsule Rfl: 1   losartan (COZAAR) 100 MG tablet Take 1 tablet by mouth daily  Disp: 90 tablet Rfl: 3   simvastatin (ZOCOR) 20 MG tablet Take 1 tablet by mouth nightly Disp: 90 tablet Rfl: 2   valACYclovir (VALTREX) 500 MG tablet Take 4 pills at the start of outbreak and 4 pills 12 hours later Disp: 20 tablet Rfl: 1     No current facility-administered medications on file prior to visit.     Social History: no tob, drugs. No etoh.  Works at [employer]. No sig caffeine. Mostly lives in the [place] since ~2015    Family History:   M - died MI 80 had DM. F - d 83 cirrhosis. HTN in siblings.     ROS: All other systems reviewed were pertinently negative apart from the history of present illness.    PHYSICAL EXAM:   11/29/18  1041   BP: 120/76   Pulse: 72   SpO2: 96%   Weight: 57.6 kg (127 lb)   Height: 5\' 1"  (1.549 m)     Body mass index is 24 kg/m.  General: NAD, well developed, well nourished.  HEENT: Oral mucosa is moist, no icterus, no pallor noted.  NECK: No jugular venous distention, no  carotid bruits, 2+ carotid upstrokes.  CHEST: Clear to auscultation, no crackles or rhonchi. Nontender.  HEART:  No heaves or lifts. Regular rate and rhythm. No rubs. No murmur.  ABDOMEN: NABS. Soft. Nontender and nondistended.   EXTREMITIES: Warm and well perfused. No edema. 2+ pulses.   NEURO: A&O X 3, moves all extremities. Grossly nonfocal.  PSYCHIATRIC: Mood and affect are appropriate.    DATA:  LABS: Lab Results   Component Value Date    NA 142 12/16/2017    K 4.4 12/16/2017    CL 105 12/16/2017    CO2 31 12/16/2017    BUN 19 (H) 12/16/2017    CREAT 0.9 12/16/2017    GLUCOSER 149 12/16/2017    LDL 80 12/16/2017    HDL 52 12/16/2017    TG 184 (H) 12/16/2017    ALT 29 12/16/2017    AST 16 12/16/2017     No results found for: PROBNP  No results found for: BNP    EKG: (on my personal review) 11/29/2018 NSR 66 bpm.  Normal axis, intervals and ST segments.  Normal ECG.    Assessment and Plan:  HTN  -On losartan with well-controlled numbers at Md visits. See pt instructions. She may need to change to bid or nighttime  dosing depending on monitoring results.     Hyperlipidemia  -On simvastatin LDL 80 w well controlled lipids    Vertigo  - reports taking med from [place] for this. Suggested she see PCP to get switched to something from [place].     Return for follow up in Cardiology Clinic in 2 months. To contact earlier if there are any cardiac related issues.     I have spent 45 minutes in face to face time with this patient of which more than 50% was spent in counseling and/or coordination of care regarding above issues and diagnosis.  Thank you for the privilege of involving me in this patient's care.  Please feel free to contact me if you have any questions.  This patient encounter note was created using voice-recognition software and in real time. Please excuse any typographical errors that have not been edited out.     Electronically signed by: Marlou Sa, MD, 11/29/2018

## 2018-11-29 NOTE — Patient Instructions (Signed)
Check blood pressure daily for a couple weeks   Write down the numbers along with the date and time and any symptoms  Goal is <130/80  Bring in your blood pressure machine to the visit

## 2018-11-30 NOTE — Addendum Note (Signed)
Addended by: Irene Shipper on: 11/30/2018 10:45 AM     Modules accepted: Orders

## 2018-11-30 NOTE — Addendum Note (Signed)
Addended by: Irene Shipper on: 11/30/2018 10:55 AM     Modules accepted: Orders

## 2018-12-01 LAB — EKG

## 2018-12-25 MED FILL — VENLAFAXINE 37.5MG ER CAPS: 30 days supply | Qty: 30 | Fill #4 | Status: CP

## 2018-12-25 MED FILL — LOSARTAN POT 100MG: 30 days supply | Qty: 30 | Fill #2 | Status: CP

## 2018-12-25 MED FILL — *SIMVASTATIN 20MG: 30 days supply | Qty: 30 | Fill #1 | Status: CP

## 2019-01-06 ENCOUNTER — Encounter (HOSPITAL_BASED_OUTPATIENT_CLINIC_OR_DEPARTMENT_OTHER): Payer: Self-pay | Admitting: Internal Medicine

## 2019-01-06 ENCOUNTER — Ambulatory Visit: Payer: 59 | Attending: Internal Medicine | Admitting: Internal Medicine

## 2019-01-06 VITALS — BP 103/63 | HR 90 | Temp 98.5°F | Ht 61.0 in | Wt 123.5 lb

## 2019-01-06 DIAGNOSIS — H6504 Acute serous otitis media, recurrent, right ear: Secondary | ICD-10-CM | POA: Diagnosis present

## 2019-01-06 DIAGNOSIS — H7291 Unspecified perforation of tympanic membrane, right ear: Secondary | ICD-10-CM

## 2019-01-06 MED ORDER — AMOXICILLIN-POT CLAVULANATE 875-125 MG PO TABS: 1 | tablet | Freq: Two times a day (BID) | ORAL | 0 refills | 0 days | Status: AC

## 2019-01-06 MED ORDER — CIPROFLOXACIN-DEXAMETHASONE 0.3-0.1 % OT SUSP
4.0000 [drp] | Freq: Two times a day (BID) | OTIC | 2 refills | Status: DC
Start: 2019-01-06 — End: 2019-01-06

## 2019-01-06 MED ORDER — AMOXICILLIN-POT CLAVULANATE 875-125 MG PO TABS
1.0000 | ORAL_TABLET | Freq: Two times a day (BID) | ORAL | 0 refills | Status: AC
Start: 2019-01-06 — End: 2019-01-13

## 2019-01-06 MED ORDER — CIPROFLOXACIN-DEXAMETHASONE 0.3-0.1 % OT SUSP: 4 [drp] | mL | Freq: Two times a day (BID) | OTIC | 2 refills | 0 days | Status: AC

## 2019-01-06 MED ORDER — CIPROFLOXACIN-DEXAMETHASONE 0.3-0.1 % OT SUSP: 4 [drp] | mL | Freq: Two times a day (BID) | OTIC | 2 refills | 0 days | Status: DC

## 2019-01-06 MED ORDER — CIPROFLOXACIN-DEXAMETHASONE 0.3-0.1 % OT SUSP
4.00 [drp] | Freq: Two times a day (BID) | OTIC | 2 refills | Status: AC
Start: 2019-01-06 — End: 2019-01-13

## 2019-01-06 NOTE — Progress Notes (Signed)
HPI   C/o sorethroat and ear pain for 3 days  No fever   Says she has pus coming from her right ear   She says she had this problem as a child   She was teary and crying about this problem  She lives in [place] and she can comeonly on saturdays and only in the evening   She has she is worried about losing hearing due to this   She has been dealing with this as a child and she tries very hard and always is very careful to keep the ear dry  But she would like to get rid of this chronic infection         Social History     Socioeconomic History    Marital status: Married     Spouse name: Not on file    Number of children: Not on file    Years of education: Not on file    Highest education level: Not on file   Occupational History    Not on file   Social Needs    Financial resource strain: Not on file    Food insecurity:     Worry: Not on file     Inability: Not on file    Transportation needs:     Medical: Not on file     Non-medical: Not on file   Tobacco Use    Smoking status: Never Smoker    Smokeless tobacco: Never Used   Substance and Sexual Activity    Alcohol use: Yes     Comment: Occasionally    Drug use: Never    Sexual activity: Not on file   Lifestyle    Physical activity:     Days per week: Not on file     Minutes per session: Not on file    Stress: Not on file   Relationships    Social connections:     Talks on phone: Not on file     Gets together: Not on file     Attends religious service: Not on file     Active member of club or organization: Not on file     Attends meetings of clubs or organizations: Not on file     Relationship status: Not on file    Intimate partner violence:     Fear of current or ex partner: Not on file     Emotionally abused: Not on file     Physically abused: Not on file     Forced sexual activity: Not on file   Other Topics Concern    Not on file   Social History Narrative    12/16/17 She lives in [place], [place] with her daughter. Originally from [place].       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      Current Outpatient Medications:  venlafaxine (EFFEXOR-XR) 37.5 MG 24 hr capsule Take 1 capsule by mouth daily Disp: 90 capsule Rfl: 1   losartan (COZAAR) 100 MG tablet Take 1 tablet by mouth daily Disp: 90 tablet Rfl: 3   simvastatin (ZOCOR) 20 MG tablet Take  1 tablet by mouth nightly Disp: 90 tablet Rfl: 2   valACYclovir (VALTREX) 500 MG tablet Take 4 pills at the start of outbreak and 4 pills 12 hours later Disp: 20 tablet Rfl: 1     No current facility-administered medications for this visit.   Review of Patient's Allergies indicates:  No Known Allergies  ROS  As above   Denies any fever           BP 103/63  Pulse 90  Temp 98.5 F (36.9 C) (Temporal)  Ht 5\' 1"  (1.549 m)  Wt 56 kg (123 lb 7.3 oz)  SpO2 97%  BMI 23.33 kg/m2  Physical Exam    Physical Exam   Constitutional: She is oriented to person, place, and time and well-developed, well-nourished, and in no distress.   Neurological: She is alert and oriented to person, place, and time. Gait normal.   Skin: Skin is warm and dry.   Psychiatric: Mood, memory, affect and judgment normal.   Nursing note and vitals reviewed.  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 The patient was ready to learn and no apparent learning barriers were identified. I explained the diagnosis and treatment plan, and the patient expressed understanding of the content. Possible side effects of the prescribed medication(s) were explained, .  I attempted to answer any questions regarding the diagnosis and the proposed treatment.    We discussed the patients current medications. The patient expressed understanding and no barriers to adherence were identified.      Overton Mam, MD

## 2019-01-09 ENCOUNTER — Telehealth (HOSPITAL_BASED_OUTPATIENT_CLINIC_OR_DEPARTMENT_OTHER): Payer: Self-pay | Admitting: Physician Assistant

## 2019-01-09 DIAGNOSIS — H6504 Acute serous otitis media, recurrent, right ear: Secondary | ICD-10-CM

## 2019-01-09 LAB — EYE,EAR,NOSE & THROAT CULTURE

## 2019-01-09 NOTE — Progress Notes (Signed)
Called patient with Interpreter Tobi Bastos ID# [id number]). Patient answered first 2x but was disconnected. Attempted to call her a 3rd time- left voice msg to call back. I would like to see how she is doing regarding right ear pain and how much discharge. Culture came back Positive for Staph- she is on topical Cipro. Will consider PO Cipro or Bactrim if little improvement.    Lurena Joiner Roanna Reaves, PA-C (covering for Aparna)

## 2019-01-10 ENCOUNTER — Telehealth (HOSPITAL_BASED_OUTPATIENT_CLINIC_OR_DEPARTMENT_OTHER): Payer: Self-pay | Admitting: Registered Nurse

## 2019-01-10 MED ORDER — SULFAMETHOXAZOLE-TRIMETHOPRIM 800-160 MG PO TABS
1.00 | ORAL_TABLET | Freq: Two times a day (BID) | ORAL | 0 refills | Status: AC
Start: 2019-01-10 — End: 2019-01-15

## 2019-01-10 MED ORDER — SULFAMETHOXAZOLE-TRIMETHOPRIM 800-160 MG PO TABS: 1 | tablet | Freq: Two times a day (BID) | ORAL | 0 refills | 0 days | Status: AC

## 2019-01-10 MED FILL — SMZ/TMP DS  800-160: 5 days supply | Qty: 10 | Fill #0 | Status: CP

## 2019-01-10 NOTE — Progress Notes (Signed)
Telephone call to [facility]   Due to the language barrier, the phone call was conducted in [place] with an interpreter. The interpreter's name is [place]. The interpreter was on the phone during the entire phone call.    Relayed information from Lurena Joiner  She will d/c augmentin and start bactrim DS    Patient asked about referral to ENT  Asking for evening appts or Saturday appt  Advised I would route message to referral coordinator but warned these appts may not be available.  Milton Ferguson, RN, 01/10/2019 10:54 AM              Felton Clinton, PA-C  P Ec Rn Pool            Dear RN,     Please:    1. Create Telephone encounter for this patient.   2. Share with the patient the attached results: c/w ear infection- resistant to penicillin- she was given Augmentin- please have her discontinue Augmentin.     Plan:   1. A prescription for Bactrim has been sent to [facility].     2. Type of Outreach: 3 phone calls and if unable to reach send letter     3. Document the conversation in the Telephone Encounter and close the encounter, no need to send back to me.     Thank you,   Ilona Sorrel, PA-C

## 2019-01-10 NOTE — Progress Notes (Signed)
Dear RN,    Please:    1. Create Telephone encounter for this patient.  2. Share with the patient the attached results: c/w ear infection- resistant to penicillin- she was given Augmentin- please have her discontinue Augmentin.    Plan:  1. A prescription for Bactrim has been sent to [facility].    2. Type of Outreach: 3 phone calls and if unable to reach send letter    3. Document the conversation in the Telephone Encounter and close the encounter, no need to send back to me.     Thank you,  Ilona Sorrel, PA-C

## 2019-01-10 NOTE — Addendum Note (Signed)
Addended by: Ilona Sorrel on: 01/10/2019 09:29 AM     Modules accepted: Orders

## 2019-01-24 MED FILL — VENLAFAXINE 37.5MG ER CAPS: 30 days supply | Qty: 30 | Fill #5 | Status: CP

## 2019-01-24 MED FILL — LOSARTAN POT 100MG: 30 days supply | Qty: 30 | Fill #3

## 2019-01-24 MED FILL — SIMVASTATIN 20MG: 30 days supply | Qty: 30 | Fill #2

## 2019-01-24 MED FILL — VENLAFAXINE 37.5MG ER CAPS: 30 days supply | Qty: 30 | Fill #5

## 2019-01-24 MED FILL — SIMVASTATIN 20MG: 30 days supply | Qty: 30 | Fill #2 | Status: CP

## 2019-01-24 MED FILL — LOSARTAN POT 100MG: 30 days supply | Qty: 30 | Fill #3 | Status: CP

## 2019-01-31 ENCOUNTER — Encounter (HOSPITAL_BASED_OUTPATIENT_CLINIC_OR_DEPARTMENT_OTHER): Payer: Self-pay | Admitting: Cardiovascular Disease

## 2019-02-22 ENCOUNTER — Other Ambulatory Visit (HOSPITAL_BASED_OUTPATIENT_CLINIC_OR_DEPARTMENT_OTHER): Payer: Self-pay | Admitting: Family Medicine

## 2019-02-22 DIAGNOSIS — N951 Menopausal and female climacteric states: Secondary | ICD-10-CM

## 2019-02-22 MED FILL — VENLAFAXINE 37.5MG ER CAPS: 90 days supply | Qty: 90 | Fill #0 | Status: CP

## 2019-02-22 MED FILL — VALACYCLOVIR 500MG: 20 days supply | Qty: 20 | Fill #1 | Status: CP

## 2019-02-22 MED FILL — SIMVASTATIN 20MG: 30 days supply | Qty: 30 | Fill #3 | Status: CP

## 2019-02-22 MED FILL — LOSARTAN POT 100MG: 30 days supply | Qty: 30 | Fill #4 | Status: CP

## 2019-02-22 NOTE — Progress Notes (Signed)
PER Pharmacy, Wendy Flores is a 68 year old female has requested a refill of effexor xr.      Last Office Visit: 01/06/19 with Overton Mam  Last Physical Exam: 12/16/2017      Other Med Adult:  Most Recent BP Reading(s)  01/06/19 : 103/63        Cholesterol (mg/dL)   Date Value   71/69/6789 170     LOW DENSITY LIPOPROTEIN DIRECT (mg/dL)   Date Value   38/08/1750 80     HIGH DENSITY LIPOPROTEIN (mg/dL)   Date Value   02/58/5277 52     TRIGLYCERIDES (mg/dL)   Date Value   82/42/3536 184 (H)         No results found for: TSHSC      No results found for: TSH    No results found for: HGBA1C    No results found for: POCA1C      No results found for: INR    SODIUM (mmol/L)   Date Value   12/16/2017 142       POTASSIUM (mmol/L)   Date Value   12/16/2017 4.4           CREATININE (mg/dL)   Date Value   14/43/1540 0.9       Documented patient preferred pharmacies:    [facility] [place] - [address]  Phone: [phone] Fax: [phone]

## 2019-02-23 ENCOUNTER — Other Ambulatory Visit: Payer: Self-pay

## 2019-03-16 ENCOUNTER — Other Ambulatory Visit: Payer: Self-pay

## 2019-03-16 ENCOUNTER — Telehealth (HOSPITAL_BASED_OUTPATIENT_CLINIC_OR_DEPARTMENT_OTHER): Payer: Self-pay

## 2019-03-16 NOTE — Progress Notes (Signed)
Patient's language of care NEEDING PORTUGUESE INTERPRETER    Name of caller patient with portuguese Interpreter         Relationship to patient self         Contact phone number [phone]          Reason for call  Patient works at [employer] she has been out of work due to having symptoms pt still does not feel well she has a fever, cold her job asking for her to return      Has the patient called or spoken to someone about this issue before?

## 2019-03-19 ENCOUNTER — Ambulatory Visit (HOSPITAL_BASED_OUTPATIENT_CLINIC_OR_DEPARTMENT_OTHER): Payer: 59 | Admitting: Physician Assistant

## 2019-03-19 ENCOUNTER — Other Ambulatory Visit (HOSPITAL_BASED_OUTPATIENT_CLINIC_OR_DEPARTMENT_OTHER): Payer: Self-pay | Admitting: Otolaryngology

## 2019-03-19 MED FILL — SIMVASTATIN 20MG: 30 days supply | Qty: 30 | Fill #4 | Status: CP

## 2019-03-19 MED FILL — LOSARTAN POT 100MG: 90 days supply | Qty: 90 | Fill #5 | Status: CP

## 2019-03-19 NOTE — Televisit Note (Signed)
Attempted calls at:    10:00 am  10: 20 am   10:30 am

## 2019-03-19 NOTE — Progress Notes (Signed)
Patient left without being seen.

## 2019-03-21 ENCOUNTER — Encounter (HOSPITAL_BASED_OUTPATIENT_CLINIC_OR_DEPARTMENT_OTHER): Payer: Self-pay | Admitting: Cardiovascular Disease

## 2019-04-13 ENCOUNTER — Encounter (HOSPITAL_BASED_OUTPATIENT_CLINIC_OR_DEPARTMENT_OTHER): Payer: Self-pay

## 2019-04-13 ENCOUNTER — Ambulatory Visit: Payer: 59

## 2019-04-13 ENCOUNTER — Other Ambulatory Visit: Payer: Self-pay

## 2019-04-13 DIAGNOSIS — E7800 Pure hypercholesterolemia, unspecified: Secondary | ICD-10-CM | POA: Diagnosis not present

## 2019-04-13 DIAGNOSIS — F321 Major depressive disorder, single episode, moderate: Secondary | ICD-10-CM | POA: Insufficient documentation

## 2019-04-13 DIAGNOSIS — I10 Essential (primary) hypertension: Secondary | ICD-10-CM | POA: Insufficient documentation

## 2019-04-13 MED ORDER — VENLAFAXINE HCL ER 75 MG PO CP24: 75 mg | capsule | Freq: Every day | ORAL | 1 refills | 0 days | Status: AC

## 2019-04-13 MED ORDER — VENLAFAXINE HCL ER 75 MG PO CP24: 75 mg | capsule | Freq: Every day | ORAL | 1 refills | 0 days | Status: DC

## 2019-04-13 MED ORDER — VENLAFAXINE HCL ER 75 MG PO CP24
75.0000 mg | ORAL_CAPSULE | Freq: Every day | ORAL | 1 refills | Status: DC
Start: 2019-04-13 — End: 2019-04-13

## 2019-04-13 MED ORDER — VENLAFAXINE HCL ER 75 MG PO CP24
75.0000 mg | ORAL_CAPSULE | Freq: Every day | ORAL | 1 refills | Status: DC
Start: 2019-04-13 — End: 2019-09-25

## 2019-04-13 MED FILL — VENLAFAXINE 75MG ER CAPSULES: 90 days supply | Qty: 90 | Fill #0

## 2019-04-13 NOTE — Televisit Note (Signed)
This patient was identified as meeting criteria for a televisit rather than an in person visit due to public health concerns around COVID-19. A complete assessment and plan is detailed in the note, all of which were conducted remotely using telephone technology.  Patient identity was verbally confirmed by the patient/guardian with 2 identifiers (name, date of birth, and/or address) at the beginning of the visit  Patient/guardian verbally consented to care by televisit as appropriate.   Patient/guardian was located at home during the visit and confirmed that they understood they were encouraged to be in private location due to personal health information being discussed.  Patient/guardian was informed how to access face-to-face care in the event of an emergency.  Provider was located outside the office at a secure location during the visit.  If this is a new patient visit, all available records and medical history were reviewed by the provider.  Visit length was 30 minutes and counseling was done on the diagnoses indicated in the visit.      Subjective     Wendy Flores is a 68 year old female presents with c/o memory loss  Teleephone interpreter used, daughter and son-in-law on phone from [place]  Very forgetful, anything I can take  Anxious and stressed out, neck stiffness for couple days  Something new last few months  Daughter interjects her mother is so anxious that she forgets what she said 10 yrs ago  Worried about Coronavirus in [place]   Crying a lot, emotionally labile,  In [place] x 2 years,  depressed  family wants her to have therapy  +anorexia, increased sleep latency but once asleep stays asleep, says she lost weight  unintentially but put weight back over last 2 months, poor concentration  No SI  Labyranthitis, high blood pressure, high cholesterol  Reports taking medication for "memory" that  Dr. Manuela Neptune prescribed, not working, wondered if she should take more      Social History     Socioeconomic  History    Marital status: Married     Spouse name: Not on file    Number of children: Not on file    Years of education: Not on file    Highest education level: Not on file   Occupational History    Not on file   Social Needs    Financial resource strain: Not on file    Food insecurity:     Worry: Not on file     Inability: Not on file    Transportation needs:     Medical: Not on file     Non-medical: Not on file   Tobacco Use    Smoking status: Never Smoker    Smokeless tobacco: Never Used   Substance and Sexual Activity    Alcohol use: Yes     Comment: Occasionally    Drug use: Never    Sexual activity: Not on file   Lifestyle    Physical activity:     Days per week: Not on file     Minutes per session: Not on file    Stress: Not on file   Relationships    Social connections:     Talks on phone: Not on file     Gets together: Not on file     Attends religious service: Not on file     Active member of club or organization: Not on file     Attends meetings of clubs or organizations: Not on file  Relationship status: Not on file    Intimate partner violence:     Fear of current or ex partner: Not on file     Emotionally abused: Not on file     Physically abused: Not on file     Forced sexual activity: Not on file   Other Topics Concern    Not on file   Social History Narrative    12/16/17 She lives in [place], [place] with her daughter. Originally from [place].      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      Current Outpatient Medications:  venlafaxine (EFFEXOR-XR) 37.5 MG 24 hr capsule Take 1 capsule by mouth daily Disp: 90 capsule Rfl: 3   losartan (COZAAR) 100 MG tablet Take 1 tablet by mouth daily Disp: 90 tablet  Rfl: 3   simvastatin (ZOCOR) 20 MG tablet Take 1 tablet by mouth nightly Disp: 90 tablet Rfl: 2   valACYclovir (VALTREX) 500 MG tablet Take 4 pills at the start of outbreak and 4 pills 12 hours later Disp: 20 tablet Rfl: 1     No current facility-administered medications for this visit.   Review of Patient's Allergies indicates:  No Known Allergies           Objective     There were no vitals taken for this visit.  Deferred physical exam  General Behavior cooperative   Gait and Station/Motor:deferred         Attention/Concentration:intact  Speech/Language:normal rate, tone, and prosody, non pressured    Mood/Affect:anxious, labile  Thought Process/Associations:linear  Thought Content/Perceptions:preoccupied with fears about virus, missing family, concern for memory loss, no delusions or paranoia  Judgment/Insight:limited  Orientation:fully   Memory:intact, not formally tested   Fund of knowledge:limited  Suicidal/Homicidal:denies SI or HI         Assessment   Problem List Items Addressed This Visit        Cardiac and Vasculature    Essential hypertension    Pure hypercholesterolemia       Mental Health    Current moderate episode of major depressive disorder without prior episode (HCC) - Primary     Endorses multiple neuroveg  Sx: anxiety/depressed mood,crying, poor concentration, anhedonia, difficulty falling asleep - denies SI or HI  Increase venlefaxine to 75 mg  [facility]         Relevant Orders    RFL TO ADULT OUTPT PSYCH (NEW) BH PTS (Completed)           We discussed increasing venlefaxine and the importance of medication compliance. The patient was ready to learn and no apparent learning barriers were identified. I explained the diagnosis and treatment plan, and the patient expressed understanding of the content. Possible side effects of the prescribed medication(s) were explained, including delay in effectiveness of onset, possible ineffectiveness overall, increase in BP, worsening anxiety.   I attempted to answer any questions regarding the diagnosis and the proposed treatment.    We discussed the patients current medications. The patient expressed understanding and no barriers to adherence were identified.         follow  up with PCP in 4 weeks    Lanora Manis A. Earlene Plater, MD

## 2019-04-15 ENCOUNTER — Encounter (HOSPITAL_BASED_OUTPATIENT_CLINIC_OR_DEPARTMENT_OTHER): Payer: Self-pay

## 2019-04-15 DIAGNOSIS — F321 Major depressive disorder, single episode, moderate: Secondary | ICD-10-CM | POA: Insufficient documentation

## 2019-04-15 DIAGNOSIS — E7800 Pure hypercholesterolemia, unspecified: Secondary | ICD-10-CM | POA: Insufficient documentation

## 2019-04-15 DIAGNOSIS — I10 Essential (primary) hypertension: Secondary | ICD-10-CM | POA: Insufficient documentation

## 2019-04-15 NOTE — Assessment & Plan Note (Signed)
Endorses multiple neuroveg  Sx: anxiety/depressed mood,crying, poor concentration, anhedonia, difficulty falling asleep - denies SI or HI  Increase venlefaxine to 75 mg  Refer to [facility] for therapy

## 2019-04-19 ENCOUNTER — Other Ambulatory Visit: Payer: Self-pay

## 2019-04-23 MED FILL — SIMVASTATIN 20MG: 30 days supply | Qty: 30 | Fill #5 | Status: CP

## 2019-04-30 NOTE — Televisit Note (Signed)
The patient left the office before the visit was finished.    Called twice w phone interpreter  Left msg w number to reschedule

## 2019-05-02 ENCOUNTER — Other Ambulatory Visit: Payer: Self-pay

## 2019-05-02 ENCOUNTER — Ambulatory Visit: Payer: 59 | Admitting: Cardiovascular Disease

## 2019-05-02 DIAGNOSIS — Z5321 Procedure and treatment not carried out due to patient leaving prior to being seen by health care provider: Secondary | ICD-10-CM

## 2019-05-07 ENCOUNTER — Ambulatory Visit (HOSPITAL_BASED_OUTPATIENT_CLINIC_OR_DEPARTMENT_OTHER): Payer: 59

## 2019-05-09 ENCOUNTER — Ambulatory Visit: Payer: 59 | Attending: Cardiovascular Disease | Admitting: Cardiovascular Disease

## 2019-05-09 ENCOUNTER — Other Ambulatory Visit: Payer: Self-pay

## 2019-05-09 DIAGNOSIS — I10 Essential (primary) hypertension: Secondary | ICD-10-CM | POA: Diagnosis present

## 2019-05-09 DIAGNOSIS — E785 Hyperlipidemia, unspecified: Secondary | ICD-10-CM | POA: Diagnosis present

## 2019-05-09 NOTE — Progress Notes (Signed)
CARDIOLOGY CLINIC CONSULT NOTE - TELE visit 2/2 covid-19 pandemic  Date of visit: 05/09/2019  Language: [place] - phone interpreter used    History of present illness:   68 year old female with htn, hyperlipidemia, prior diagnosis of unspecified heart disease consult for these issues.    Says she saw a non-cardiologist in [place] in summer 2019 and was suggested to see a cardiologist in the [place] but doesn't know why. No acute cardiac sxs. No known cardiac dx other than htn or hl. Remotely had stress tests that were normal.    Dx w HTN ~2015 in [place]. Reports having 24 hr bp monitor and being told BP was highest at night. Started on meds. Continued meds from [place] until established care at [facility] in 1/19. Was on atenolol until 7/19 then changed to losartan. Was due to possible GI s/e of atenolol. Thinks stomach is the same on atenolol and losartan so unlikely bp med related    In 7/19 seen at ER in [place] for high BP. Woke at night, didn't feel good. Ha and nausea. Took BP went from 160 to 170 to 180 etc. Went to ER. BP in er listed as 150/100. Told to increase losartan from 50 to 100.     Has home bp machine for arm at home but only checks when not feeling well.    Seen by me 11/29/18 - asked to keep bp log at home and bring in machine to get checked    televisit 05/09/19  -No cp, sob, palps, cva sxs, syncope  -exercise - none, but no restrictions  -bp at home is fine - 110-130/70-85  -one day got 160 - was very upset that day  -venlafaxine   -working startin 1pm -1 am shift - [employer]  -takes losartan 100 mg usually around 10 am    Most Recent BP Reading(s)  01/06/19 : 103/63  11/29/18 : 120/76  06/16/18 : 106/65      Most Recent Weight Reading(s)  01/06/19 : 56 kg (123 lb 7.3 oz)  11/29/18 : 57.6 kg (127 lb)  06/16/18 : 59.4 kg (131 lb)  06/13/18 : 59.9 kg (132 lb)  12/16/17 : 61.2 kg (135 lb)      Past Medical History:  Patient Active Problem List:     Hyperopia of both eyes with regular astigmatism and  presbyopia     Colon polyp     Diverticulosis of colon     Essential hypertension     Pure hypercholesterolemia     Current moderate episode of major depressive disorder without prior episode (HCC)      Allergies:  Review of Patient's Allergies indicates:  No Known Allergies    Meds: centrum, vit D    Current Outpatient Medications on File Prior to Visit:  venlafaxine (EFFEXOR-XR) 75 MG 24 hr capsule Take 1 capsule by mouth daily Disp: 90 capsule Rfl: 1   losartan (COZAAR) 100 MG tablet Take 1 tablet by mouth daily Disp: 90 tablet Rfl: 3   simvastatin (ZOCOR) 20 MG tablet Take 1 tablet by mouth nightly Disp: 90 tablet Rfl: 2   valACYclovir (VALTREX) 500 MG tablet Take 4 pills at the start of outbreak and 4 pills 12 hours later Disp: 20 tablet Rfl: 1     No current facility-administered medications on file prior to visit.     Social History: no tob, drugs. No etoh. Works at [employer]. No sig caffeine. Mostly lives in the [place] since ~2015  Family History:M - died MI 7975 had DM. F - d 83 cirrhosis. HTN in siblings.    ROS: All other systems reviewed were pertinently negative apart from the history of present illness.    PHYSICAL EXAM:  See recent vitals above  No sob w talking  A&O X 3  Mood appropriate.    DATA:  LABS: Lab Results   Component Value Date    NA 142 12/16/2017    K 4.4 12/16/2017    CL 105 12/16/2017    CO2 31 12/16/2017    BUN 19 (H) 12/16/2017    CREAT 0.9 12/16/2017    GLUCOSER 149 12/16/2017    LDL 80 12/16/2017    HDL 52 12/16/2017    TG 184 (H) 12/16/2017    ALT 29 12/16/2017    AST 16 12/16/2017     No results found for: PROBNP  No results found for: BNP    EKG: (on my personal review) 11/29/2018 NSR 66 bpm. Normal axis, intervals and ST segments. Normal ECG.    Assessment and Plan:  HTN  -On losartan with well-controlled numbers at MD visits and at home. If find in the future there is a pattern with am vs pm could consider changing to bid dosing.      Hyperlipidemia  -On simvastatin LDL 80 w well  controlled lipids    Taking glucosamine and vit D    Return for follow up in Cardiology Clinic as needed    Thank you for the privilege of involving me in this patient's care.  Please feel free to contact me if you have any questions.  This patient encounter note was created using voice-recognition software and in real time. Please excuse any typographical errors that have not been edited out.     Electronically signed by: Marlou SaErica L Ivet Guerrieri, MD, 05/09/2019

## 2019-05-29 ENCOUNTER — Ambulatory Visit: Payer: 59

## 2019-05-29 DIAGNOSIS — F419 Anxiety disorder, unspecified: Secondary | ICD-10-CM | POA: Insufficient documentation

## 2019-05-29 DIAGNOSIS — F321 Major depressive disorder, single episode, moderate: Secondary | ICD-10-CM | POA: Insufficient documentation
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
## 2019-05-29 NOTE — PSYCHOTHERAPY NOTE (Deleted)
OUTPATIENT PSYCHIATRY PSYCHOTHERAPY NOTE    Psychotherapy notes are sensitive notes recorded by a mental health provider documenting details of trauma, family dynamics, or other relevant sensitive information pertaining to the patient, or describing the content of conversation during an individual or group counseling session with that patient. These notes are not part of the medical record or designated record set. These notes may only be accessed by the provider who wrote the note, or if the provider is a trainee, the trainee’s supervisor for purposes of supervision.  Psychotherapy notes may be completed in addition to, but not in place of, a Progress Note for a clinical encounter. Psychotherapy notes are not required for every clinical encounter. Psychotherapy notes do not include information about session start and stop times, medications, modalities and frequency of treatment, results of clinical tests, or any summary of the following:  diagnosis, functional status, treatment plan, symptoms, prognosis, and progress to date.

## 2019-06-15 MED FILL — LOSARTAN POT 100MG: 30 days supply | Qty: 30 | Fill #4 | Status: CP

## 2019-07-02 ENCOUNTER — Other Ambulatory Visit (HOSPITAL_BASED_OUTPATIENT_CLINIC_OR_DEPARTMENT_OTHER): Payer: Self-pay | Admitting: Family Medicine

## 2019-07-02 DIAGNOSIS — E7800 Pure hypercholesterolemia, unspecified: Secondary | ICD-10-CM

## 2019-07-02 NOTE — Progress Notes (Signed)
PER Patient (self), Stewart Pimenta is a 68 year old female has requested a refill of   SIMVASTATIN .      Last Office Visit: 04/13/2019   with DAVIS, E   Last Physical Exam: 12/16/17    There are no preventive care reminders to display for this patient.    Statin Med:  Lipids   Cholesterol (mg/dL)   Date Value   12/16/2017 170     LOW DENSITY LIPOPROTEIN DIRECT (mg/dL)   Date Value   12/16/2017 80     HIGH DENSITY LIPOPROTEIN (mg/dL)   Date Value   12/16/2017 52     TRIGLYCERIDES (mg/dL)   Date Value   12/16/2017 184 (H)   LFTs   ALANINE AMINOTRANSFERASE (U/L)   Date Value   12/16/2017 29       ASPARTATE AMINOTRANSFERASE (U/L)   Date Value   12/16/2017 16       ALBUMIN (g/dL)   Date Value   12/16/2017 4.2       TOTAL PROTEIN (g/dL)   Date Value   12/16/2017 7.0       No results found for: DBILI    BILIRUBIN TOTAL (mg/dL)   Date Value   12/16/2017 0.3       ALKALINE PHOSPHATASE (U/L)   Date Value   12/16/2017 74       Documented patient preferred pharmacies:        [facility] [place] [place] [address]  Phone: [phone] Fax: [phone]

## 2019-07-03 MED ORDER — SIMVASTATIN 20 MG PO TABS
20.0000 mg | ORAL_TABLET | Freq: Every evening | ORAL | 2 refills | Status: DC
Start: 2019-07-03 — End: 2019-09-25

## 2019-07-09 ENCOUNTER — Other Ambulatory Visit (HOSPITAL_BASED_OUTPATIENT_CLINIC_OR_DEPARTMENT_OTHER): Payer: Self-pay | Admitting: Otolaryngology

## 2019-07-25 MED FILL — LOSARTAN POT 100MG: 30 days supply | Qty: 30 | Fill #5 | Status: CP

## 2019-08-03 ENCOUNTER — Encounter (HOSPITAL_BASED_OUTPATIENT_CLINIC_OR_DEPARTMENT_OTHER): Payer: Self-pay | Admitting: Otolaryngology

## 2019-08-03 ENCOUNTER — Other Ambulatory Visit: Payer: Self-pay

## 2019-08-03 ENCOUNTER — Ambulatory Visit: Payer: 59 | Attending: Internal Medicine | Admitting: Otolaryngology

## 2019-08-03 DIAGNOSIS — H9211 Otorrhea, right ear: Secondary | ICD-10-CM | POA: Insufficient documentation

## 2019-08-03 NOTE — Progress Notes (Signed)
History:  Wendy Flores is a 68 year old female who reports a history of right TM perforation and is seen for monitoring.    Impressions:  Tympanometry is consistent with normal TM mobility and normal middle ear pressure in both ears. Pure tone testing revealed a normal hearing bilaterally. WRS Excellent bilaterally.    Recommendation:  Re-evaluation upon Physician's request.

## 2019-08-03 NOTE — Progress Notes (Signed)
HPI: 68 year old female Wendy Flores complaints of recurrent right otorrhea 3-4x per yr with the last episode 5 mo ago.  Patient has no hearing concerns.  Today there is No otorrhea, No otalgia, No vertigo.    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  Past ear surgery: No  Otologic FH: No     Sx Relief factors No  Sx Exacerbation factors Yes, if water gets to the ear or having a cold.    Past Medical History:  No date: HTN (hypertension)  No date: Labyrinthitis    Review of Patient's Allergies indicates:  No Known Allergies      Current Outpatient Medications on File Prior to Visit:  simvastatin (ZOCOR) 20 MG tablet Take 1 tablet by mouth nightly Disp: 90 tablet Rfl: 2   venlafaxine (EFFEXOR-XR) 75 MG 24 hr capsule Take 1 capsule by mouth daily Disp: 90 capsule Rfl: 1   losartan (COZAAR) 100 MG tablet Take 1 tablet by mouth daily Disp: 90 tablet Rfl: 3     No current facility-administered medications on file prior to visit.     Social and family history reviewed either in EPIC or with patient and are not contributory unless specifically noted in HPI.    Review of Systems  Constitutional normal.  CV/Palpitations No.  Resp/SOB No.  Integ/Skin lesions No.  MSk/Pain No.  Psych normal.    Physical Exam  There were no vitals filed for this visit.    Constitutional: normal.  Facial lesions: No.  Ears: Right: Obstructing cerumen:No. EAC dry and is open. TM normal.    Left:  Obstructing cerumen:No. EAC dry and is open. TM normal.   Facial Nerve: normal One out of 6 House-Brackmann scale bilaterally.  Sinus: Pain No.  Neck: Soft. Trachea midline. No palpable masses.  Thyroid: Normal to palpation.  Lymph:  No palpable LN in the neck.  Salivary glands:  Normal size, non-tender.  Communication: Voice normal.    Eyes: PERRLA and EOMI. Nystagmus No  Respiratory drive spontaneous and comfortable.  Cardiac rhythm regular.  Neuro: normal.  Mood/Psych: normal.    I personally reviewed the patient's  audiogram that was ordered by me today and agree with audiologist's findings and recommendation.    Bilateral normal audiogram threshold with normal speech discrimination. Bilateral tympanometry was also normal.    A/P  68 year old female with recurrent right otorrhea since childhood has normal audiogram.  Recommend CT for further evaluation.  Patient declined observation at this point.  Dry precaution.    This record was generated using voice recognition software. I proof-read and corrected any voice recognitions errors found. Please excuse any remaining voice recognition errors which were not detected.

## 2019-08-07 ENCOUNTER — Ambulatory Visit (HOSPITAL_BASED_OUTPATIENT_CLINIC_OR_DEPARTMENT_OTHER): Payer: Self-pay | Admitting: Family Medicine

## 2019-08-23 ENCOUNTER — Ambulatory Visit
Admission: RE | Admit: 2019-08-23 | Discharge: 2019-08-23 | Disposition: A | Discharge status: Home / Self Care | Payer: 59 | Attending: Otolaryngology | Admitting: Otolaryngology

## 2019-08-23 ENCOUNTER — Other Ambulatory Visit: Payer: Self-pay

## 2019-08-23 DIAGNOSIS — H9211 Otorrhea, right ear: Secondary | ICD-10-CM | POA: Diagnosis not present

## 2019-08-31 ENCOUNTER — Other Ambulatory Visit (HOSPITAL_BASED_OUTPATIENT_CLINIC_OR_DEPARTMENT_OTHER): Payer: Self-pay | Admitting: Family Medicine

## 2019-08-31 DIAGNOSIS — I10 Essential (primary) hypertension: Secondary | ICD-10-CM

## 2019-08-31 MED FILL — LOSARTAN POT 100MG: 90 days supply | Qty: 90 | Fill #0

## 2019-08-31 NOTE — Progress Notes (Signed)
PER Pharmacy, Wendy Flores is a 68 year old female has requested a refill of losartan.      Last Office Visit: 04/13/2019 with Honor Loh  Last Physical Exam: 12/16/2017    There are no preventive care reminders to display for this patient.    HTN Med:    Most Recent BP Reading(s)  01/06/19 : 103/63  11/29/18 : 120/76  06/16/18 : 106/65      Documented patient preferred pharmacies:    [facility], [place]. [place]  Phone: [phone] Fax: [phone]

## 2019-09-14 ENCOUNTER — Encounter (HOSPITAL_BASED_OUTPATIENT_CLINIC_OR_DEPARTMENT_OTHER): Payer: Self-pay | Admitting: Otolaryngology

## 2019-09-14 ENCOUNTER — Other Ambulatory Visit: Payer: Self-pay

## 2019-09-14 ENCOUNTER — Ambulatory Visit: Payer: 59 | Attending: Internal Medicine | Admitting: Otolaryngology

## 2019-09-14 DIAGNOSIS — H9211 Otorrhea, right ear: Secondary | ICD-10-CM | POA: Diagnosis present

## 2019-09-14 DIAGNOSIS — G9389 Other specified disorders of brain: Secondary | ICD-10-CM | POA: Diagnosis not present

## 2019-09-14 NOTE — Progress Notes (Signed)
Reviewed patient's previous note on 08/03/19.    HPI: 68 year old female Wendy Flores is in seen again for complaint(s) of recurrent right otorrhea since childhood with normal audiogram. Pt completed CT. A few days ago she had sharp mild right otalgia lasting seconds during the rainy days. Sx then resolved without tx.  Today there is No otorrhea, No otalgia, No vertigo.    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  Past ear surgery: No  Otologic FH: No     Sx Relief factors No  Sx Exacerbation factors Yes, if water gets to the ear or having a cold..    Review of Patient's Allergies indicates:  No Known Allergies    Past Medical History:  No date: HTN (hypertension)  No date: Labyrinthitis    losartan (COZAAR) 100 MG tablet, TAKE 1 TABLET BY MOUTH DAILY, Disp: 90 tablet, Rfl: 3  simvastatin (ZOCOR) 20 MG tablet, Take 1 tablet by mouth nightly, Disp: 90 tablet, Rfl: 2  venlafaxine (EFFEXOR-XR) 75 MG 24 hr capsule, Take 1 capsule by mouth daily, Disp: 90 capsule, Rfl: 1    No current facility-administered medications on file prior to visit.       Social and family history reviewed in [place] and are not changed.    Review of Systems  Constitutional No.  CV/Palpitations No.  Resp/Breathing No.  MSk/Pain No.  Integ/Skin lesions No.    Physical Exam  There were no vitals filed for this visit.    Constitutional: normal  Ears: Right: Obstructing cerumen:No. EAC dry and is open. TM normal.    Left:  Obstructing cerumen:No. EAC dry and is open. TM normal.   Neck: Soft. Trachea midline. No palpable masses.  Thyroid: Normal to palpation.  Lymph:  No palpable LN in the neck.  Salivary glands:  Normal size, non-tender.  Communication: Voice normal.    Eyes: EOMI. Nystagmus No  Respiratory drive spontaneous and comfortable.  Cardiac rhythm regular.  Neuro: normal.  Mood/Psych: normal.    CT of IAC on 08/23/19 was reviewed independently. It showed B well pneumatized and aerated mastoids and middle ear. 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 The bony margin around right jugular bulb had some dehiscence but mostly smooth without erosion.     A/P  68 year old female with right recurrent otorrhea has no signs of OM or mastoiditis. Recommend strict dry ear precaution. For the enlarged jugular bulb, it is most likely anatomic variation. But will proceed with MRI of IAC per radiology recommendation. Televisit afterwards.     This record was generated using voice recognition software. I proof-read and corrected any voice recognitions errors found. Please excuse any remaining voice recognition errors which were not detected.

## 2019-09-18 ENCOUNTER — Other Ambulatory Visit: Payer: Self-pay

## 2019-09-18 ENCOUNTER — Other Ambulatory Visit (HOSPITAL_BASED_OUTPATIENT_CLINIC_OR_DEPARTMENT_OTHER): Payer: Self-pay | Admitting: Internal Medicine

## 2019-09-18 NOTE — Telephone Encounter (Signed)
PER Patient (self), Wendy Flores is a 68 year old female has requested a refill of CIPRODEX.      Last Office Visit: 04/13/19 with DAVIS, E  Last Physical Exam: 12/16/2017    There are no preventive care reminders to display for this patient.    Other Med Adult:  Most Recent BP Reading(s)  01/06/19 : 103/63        Cholesterol (mg/dL)   Date Value   12/16/2017 170     LOW DENSITY LIPOPROTEIN DIRECT (mg/dL)   Date Value   12/16/2017 80     HIGH DENSITY LIPOPROTEIN (mg/dL)   Date Value   12/16/2017 52     TRIGLYCERIDES (mg/dL)   Date Value   12/16/2017 184 (H)         No results found for: TSHSC      No results found for: TSH    No results found for: HGBA1C    No results found for: POCA1C      No results found for: INR    SODIUM (mmol/L)   Date Value   12/16/2017 142       POTASSIUM (mmol/L)   Date Value   12/16/2017 4.4           CREATININE (mg/dL)   Date Value   12/16/2017 0.9       Documented patient preferred pharmacies:    [facility], [address]  Phone: [phone] Fax: [phone]

## 2019-09-25 ENCOUNTER — Ambulatory Visit: Payer: 59 | Attending: Family Medicine | Admitting: Physician Assistant

## 2019-09-25 DIAGNOSIS — E7800 Pure hypercholesterolemia, unspecified: Secondary | ICD-10-CM | POA: Diagnosis not present

## 2019-09-25 DIAGNOSIS — R928 Other abnormal and inconclusive findings on diagnostic imaging of breast: Secondary | ICD-10-CM | POA: Insufficient documentation

## 2019-09-25 DIAGNOSIS — K219 Gastro-esophageal reflux disease without esophagitis: Secondary | ICD-10-CM | POA: Diagnosis present

## 2019-09-25 DIAGNOSIS — H539 Unspecified visual disturbance: Secondary | ICD-10-CM | POA: Insufficient documentation

## 2019-09-25 DIAGNOSIS — I10 Essential (primary) hypertension: Secondary | ICD-10-CM | POA: Diagnosis not present

## 2019-09-25 DIAGNOSIS — B009 Herpesviral infection, unspecified: Secondary | ICD-10-CM | POA: Insufficient documentation

## 2019-09-25 MED ORDER — SIMVASTATIN 20 MG PO TABS
20.0000 mg | ORAL_TABLET | Freq: Every evening | ORAL | 3 refills | Status: DC
Start: 2019-09-25 — End: 2020-10-09

## 2019-09-25 MED ORDER — VENLAFAXINE HCL ER 75 MG PO CP24
75.0000 mg | ORAL_CAPSULE | Freq: Every day | ORAL | 3 refills | Status: DC
Start: 2019-09-25 — End: 2020-07-02

## 2019-09-25 MED ORDER — FAMOTIDINE 10 MG PO TABS
10.0000 mg | ORAL_TABLET | Freq: Two times a day (BID) | ORAL | 1 refills | Status: DC
Start: 2019-09-25 — End: 2020-02-28

## 2019-09-25 MED ORDER — LOSARTAN POTASSIUM 100 MG PO TABS
100.0000 mg | ORAL_TABLET | Freq: Every day | ORAL | 3 refills | Status: DC
Start: 2019-09-25 — End: 2020-07-02

## 2019-09-25 MED ORDER — ACYCLOVIR 5 % EX OINT
TOPICAL_OINTMENT | CUTANEOUS | 0 refills | Status: AC
Start: 2019-09-25 — End: 2019-10-25

## 2019-09-25 NOTE — Progress Notes (Signed)
Chief Complaint:No chief complaint on file.      Language: [place] ([place])    HPI  Braeleigh Pyper is a 68 year old female who presents to the clinic due to:    #eye   - needs eye exam  - no new symptoms     #mammogram   - needs follow up, had abnormal screen 2019   - denies focal breast pain     R breast US/ Mammo 2019:   - IMPRESSION:    Punctate microcalcifications in the right breast.      No sonographic correlate corresponding to the mammographic finding in    the left breast.      Probable small fibroadenoma or a complicated cyst in the right breast.    It somewhat difficult to be certain this finding corresponds to the    mammographic abnormality.      Recommendation is to obtain six-month bilateral diagnostic mammogram    with magnification view of the microcalcifications in the right    breast. Repeat right breast ultrasound is also recommended.       IMPRESSION:    Punctate microcalcifications in the right breast.      No sonographic correlate corresponding to the mammographic finding in    the left breast.      Probable small fibroadenoma or a complicated cyst in the right breast.    It somewhat difficult to be certain this finding corresponds to the    mammographic abnormality.      Recommendation is to obtain six-month bilateral diagnostic mammogram    with magnification view of the microcalcifications in the right    breast. Repeat right breast ultrasound is also recommended.       ROS   All systems negative other than as previously stated in the HPI.     Review of Systems   Constitutional: Negative.    Eyes: Positive for visual disturbance. Negative for photophobia, pain, discharge, redness and itching.   Respiratory: Negative.    Cardiovascular: Negative.         Objective:     Vitals:   None available     Physical Exam    General: No apparent distress or labored breathing. Patient speaking comfortably on the phone.        ASSESSMENT AND PLAN:    Lorella Gomez is  a 68 year old female with:    1. Visual disturbance  - REFERRAL TO OPHTHALMOLOGY ( INT)    2. Abnormal mammography  - [facility] US BREAST-AXILLA RIGHT; Future  - [facility] DIAGNOSTIC MAMMO BILATERAL DIGITAL WITH DBT & CAD; Future    3. Essential hypertension  Refill sent   - losartan (COZAAR) 100 MG tablet; Take 1 tablet by mouth daily  Dispense: 90 tablet; Refill: 3    4. High cholesterol  Refill sent   - simvastatin (ZOCOR) 20 MG tablet; Take 1 tablet by mouth nightly  Dispense: 90 tablet; Refill: 3    5. Herpes  Refill sent   - acyclovir (ZOVIRAX) 5 % ointment; Apply topically every 3 (three) hours  Dispense: 15 g; Refill: 0    6. Gastroesophageal reflux disease, esophagitis presence not specified  Has occasional reflux. Takes Pepcid with relief, reequesting prescription. Pepcid sent to pharmacy     Return/ call to clinic if symptoms worsen or fail to improve. Discussed when to return/call to office vs seek emergent care.    Patient and/or guardian expresses understanding and agreement with the above plan.  Venancio Poisson, PA-C, 09/25/2019

## 2019-10-10 ENCOUNTER — Other Ambulatory Visit: Payer: Self-pay

## 2019-10-10 ENCOUNTER — Ambulatory Visit
Admission: RE | Admit: 2019-10-10 | Discharge: 2019-10-10 | Disposition: A | Discharge status: Home / Self Care | Payer: 59 | Attending: Family Medicine | Admitting: Family Medicine

## 2019-10-10 ENCOUNTER — Encounter (HOSPITAL_BASED_OUTPATIENT_CLINIC_OR_DEPARTMENT_OTHER): Payer: Self-pay

## 2019-10-10 DIAGNOSIS — Z9882 Breast implant status: Secondary | ICD-10-CM | POA: Diagnosis not present

## 2019-10-10 DIAGNOSIS — N6311 Unspecified lump in the right breast, upper outer quadrant: Secondary | ICD-10-CM | POA: Diagnosis not present

## 2019-10-10 DIAGNOSIS — R921 Mammographic calcification found on diagnostic imaging of breast: Secondary | ICD-10-CM | POA: Diagnosis not present

## 2019-10-10 DIAGNOSIS — R928 Other abnormal and inconclusive findings on diagnostic imaging of breast: Secondary | ICD-10-CM | POA: Insufficient documentation

## 2019-10-23 ENCOUNTER — Other Ambulatory Visit: Payer: Self-pay

## 2019-10-23 ENCOUNTER — Ambulatory Visit
Admission: RE | Admit: 2019-10-23 | Discharge: 2019-10-23 | Disposition: A | Discharge status: Home / Self Care | Payer: 59 | Attending: Otolaryngology | Admitting: Otolaryngology

## 2019-10-23 ENCOUNTER — Other Ambulatory Visit (HOSPITAL_BASED_OUTPATIENT_CLINIC_OR_DEPARTMENT_OTHER): Payer: Self-pay

## 2019-10-23 DIAGNOSIS — H902 Conductive hearing loss, unspecified: Secondary | ICD-10-CM

## 2019-10-23 DIAGNOSIS — G9389 Other specified disorders of brain: Secondary | ICD-10-CM | POA: Diagnosis present

## 2019-10-23 LAB — CREATININE: CREATININE: 0.6 mg/dL (ref 0.4–1.2)

## 2019-10-23 MED ORDER — GADOTERIDOL 279.3 MG/ML IV SOLN
1.00 mL | Freq: Once | INTRAVENOUS | Status: AC
Start: 2019-10-23 — End: 2019-10-23
  Administered 2019-10-23: 12 mL via INTRAVENOUS

## 2019-11-08 DIAGNOSIS — U071 COVID-19: Secondary | ICD-10-CM | POA: Diagnosis not present

## 2019-11-08 DIAGNOSIS — J029 Acute pharyngitis, unspecified: Secondary | ICD-10-CM | POA: Diagnosis not present

## 2019-11-14 DIAGNOSIS — Z1159 Encounter for screening for other viral diseases: Secondary | ICD-10-CM | POA: Diagnosis not present

## 2019-11-19 DIAGNOSIS — Z1159 Encounter for screening for other viral diseases: Secondary | ICD-10-CM | POA: Diagnosis not present

## 2019-11-29 ENCOUNTER — Ambulatory Visit (HOSPITAL_BASED_OUTPATIENT_CLINIC_OR_DEPARTMENT_OTHER): Payer: Self-pay | Admitting: Optometrist

## 2020-01-04 ENCOUNTER — Other Ambulatory Visit (HOSPITAL_BASED_OUTPATIENT_CLINIC_OR_DEPARTMENT_OTHER): Payer: Self-pay | Admitting: Pediatrics

## 2020-02-12 ENCOUNTER — Telehealth (HOSPITAL_BASED_OUTPATIENT_CLINIC_OR_DEPARTMENT_OTHER): Payer: Self-pay | Admitting: Family Medicine

## 2020-02-12 NOTE — Telephone Encounter (Signed)
Wendy Flores [id number], 69 year old, female    Calls today:  Refill    !! Before starting refill request, check EPIC to see if encounter for this medication already exists !!    (May list multiple medications in this section)  Medicine Name:venlafaxine (EFFEXOR-XR) 75 MG 24 hr capsule and Ranitidine  Dosage:75mg   Frequency (how many pills, how many times a day):Take 1 capsule by mouth daily  Number of pills left:    Documented patient preferred pharmacies:    [id number] [name], [place] - [address]  Phone: [phone] Fax: [phone]    Person calling on behalf of patient: Patient (self)    CALL BACK [phone]  Best time to call back:  Cell phone:   Other phone:    Patient's language of care: [place] ([place])    Patient needs a [place] interpreter.    Patient's PCP: [name], MD

## 2020-02-13 NOTE — Telephone Encounter (Signed)
Med on file with Pharmacy:  [facility]  has/have active refills remaining. No refill is required at this time.

## 2020-02-28 ENCOUNTER — Other Ambulatory Visit (HOSPITAL_BASED_OUTPATIENT_CLINIC_OR_DEPARTMENT_OTHER): Payer: Self-pay | Admitting: Family Medicine

## 2020-02-28 MED ORDER — FAMOTIDINE 10 MG PO TABS
10.00 mg | ORAL_TABLET | Freq: Two times a day (BID) | ORAL | 1 refills | Status: AC
Start: 2020-02-28 — End: 2020-04-28

## 2020-02-28 NOTE — Telephone Encounter (Signed)
PER Patient (self), Wendy Flores is a 69 year old female has requested a refill of famotadine.      Last Office Visit: 01/06/2019 with batlapenumarthy  Last Physical Exam: n/a    There are no preventive care reminders to display for this patient.    Other Med Adult:  Most Recent BP Reading(s)  01/06/19 : 103/63        Cholesterol (mg/dL)   Date Value   34/91/7915 170     LOW DENSITY LIPOPROTEIN DIRECT (mg/dL)   Date Value   05/69/7948 80     HIGH DENSITY LIPOPROTEIN (mg/dL)   Date Value   01/65/5374 52     TRIGLYCERIDES (mg/dL)   Date Value   82/70/7867 184 (H)         No results found for: TSHSC      No results found for: TSH    No results found for: HGBA1C    No results found for: POCA1C      No results found for: INR    SODIUM (mmol/L)   Date Value   12/16/2017 142       POTASSIUM (mmol/L)   Date Value   12/16/2017 4.4           CREATININE (mg/dL)   Date Value   54/49/2010 0.6       Documented patient preferred pharmacies:    [facility] [name], [place] - [address]  Phone: [phone] Fax: [phone]

## 2020-03-11 ENCOUNTER — Other Ambulatory Visit: Payer: Self-pay

## 2020-04-03 ENCOUNTER — Other Ambulatory Visit: Payer: Self-pay

## 2020-04-22 ENCOUNTER — Ambulatory Visit (HOSPITAL_BASED_OUTPATIENT_CLINIC_OR_DEPARTMENT_OTHER): Payer: Self-pay | Admitting: Family Medicine

## 2020-04-30 ENCOUNTER — Ambulatory Visit (HOSPITAL_BASED_OUTPATIENT_CLINIC_OR_DEPARTMENT_OTHER): Payer: Self-pay | Admitting: Optometrist

## 2020-06-06 ENCOUNTER — Other Ambulatory Visit: Payer: Self-pay

## 2020-06-16 ENCOUNTER — Ambulatory Visit: Payer: 59 | Attending: Family Medicine | Admitting: Family Medicine

## 2020-06-16 DIAGNOSIS — I83893 Varicose veins of bilateral lower extremities with other complications: Secondary | ICD-10-CM | POA: Insufficient documentation

## 2020-06-16 DIAGNOSIS — R6889 Other general symptoms and signs: Secondary | ICD-10-CM | POA: Diagnosis not present

## 2020-06-16 NOTE — Progress Notes (Signed)
SUBJECTIVE:  Wendy Flores is a 69 year old female pt Wendy Side, MD, who is having a televisit today to discuss her a few concerns.     2 d of some mild flu sx: lots of chest 'secretions', throat discomfort, mild  myalgias,fatigue, mild cough, subj fevers. Took OTC cold and flu med and skipped work today. Now has a mild non itchy rash on her abdomen this AM.   No sick contacts. No SOB/DOE/wheezing.   Vaccinated against [phone]. Works in [employer].    2 yrs of R>L leg pain.  Has varicose veins and has a number of treatments for this, but has one that is "internal" that is very painful. last treatment 5 yrs ago in [place]. Sometimes while working has to run to sit down somewhere to elevate her leg for at least 10 min to be able to continue working.   Very frustrated with long standing pain.  Swollen more on that Flores. Comes and goes.  Right is worse.  No hx of blood clots.    Worse when working but sometimes happens when she is off.  Occasionally has good days.  Has compression stockings. 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       ALLERGIES:  Review of Patient's Allergies indicates:  No Known Allergies    OBJECTIVE:    Speaking comfortably in full sentences  Appropriate affect    ASSESSMENT/PLAN:   PROBLEMS:   [name] is a 69  year old female a having televisit to discuss:   (R68.89) Flu-like symptoms  (primary encounter diagnosis)  Comment: advised to take temperature, hydrate, tylenol prn. Sx are mild and pt is vaccinated against [phone] however advised drive thru testing as we are having some breakthrough infections. Call if any SOB/wheezing.    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 Refer to IR for this.  Plan: IR PENDING PROCEDURE          F/u prn.      1. The patient/guardian indicates understanding of these issues and agrees with the plan.   No apparent learning barriers were identified. I attempted to answer any questions regarding the diagnosis and the proposed treatment.  2.  I reviewed the medications with directions, their allergies, orders placed during this encounter, and follow- up instructions.  3. I reviewed the patient's medical information, allergies and medications. I reviewed the potential Flores effects of any new medications prescribed.     Electronically signed by: Mare Ferrari, MD, 06/16/2020 12:07 PM  This note is electronically signed in the electronic medical record.

## 2020-06-17 ENCOUNTER — Other Ambulatory Visit (HOSPITAL_BASED_OUTPATIENT_CLINIC_OR_DEPARTMENT_OTHER): Payer: Self-pay | Admitting: Family Medicine

## 2020-06-17 ENCOUNTER — Ambulatory Visit
Admission: RE | Admit: 2020-06-17 | Discharge: 2020-06-17 | Disposition: A | Discharge status: Home / Self Care | Payer: 59 | Attending: Family Medicine | Admitting: Family Medicine

## 2020-06-17 DIAGNOSIS — R6889 Other general symptoms and signs: Secondary | ICD-10-CM

## 2020-06-18 ENCOUNTER — Telehealth (HOSPITAL_BASED_OUTPATIENT_CLINIC_OR_DEPARTMENT_OTHER): Payer: Self-pay

## 2020-06-18 NOTE — Nursing Note (Signed)
Pt called to schedule EVLT consult appointment in IR. Appointment scheduled for Friday 7/30 at 1 pm.

## 2020-06-19 ENCOUNTER — Other Ambulatory Visit: Payer: Self-pay

## 2020-06-20 ENCOUNTER — Other Ambulatory Visit: Payer: Self-pay

## 2020-06-20 ENCOUNTER — Ambulatory Visit
Admission: RE | Admit: 2020-06-20 | Discharge: 2020-06-20 | Disposition: A | Discharge status: Home / Self Care | Payer: 59 | Attending: Family Medicine | Admitting: Family Medicine

## 2020-06-20 ENCOUNTER — Encounter (HOSPITAL_BASED_OUTPATIENT_CLINIC_OR_DEPARTMENT_OTHER): Payer: Self-pay

## 2020-06-20 ENCOUNTER — Other Ambulatory Visit (HOSPITAL_BASED_OUTPATIENT_CLINIC_OR_DEPARTMENT_OTHER): Payer: Self-pay | Admitting: Family Medicine

## 2020-06-20 DIAGNOSIS — I83893 Varicose veins of bilateral lower extremities with other complications: Secondary | ICD-10-CM

## 2020-06-20 DIAGNOSIS — I83813 Varicose veins of bilateral lower extremities with pain: Secondary | ICD-10-CM | POA: Diagnosis present

## 2020-06-20 DIAGNOSIS — M79605 Pain in left leg: Secondary | ICD-10-CM | POA: Insufficient documentation

## 2020-06-20 DIAGNOSIS — I83811 Varicose veins of right lower extremities with pain: Secondary | ICD-10-CM

## 2020-06-20 DIAGNOSIS — M79604 Pain in right leg: Secondary | ICD-10-CM | POA: Diagnosis not present

## 2020-06-20 NOTE — Narrator Note (Signed)
Moderate Sedation    Patient Care Timeline     Sedation: 06/20/2020 11:57 to 12:28     Time Event Details User    11:57:00 Assessment  Pre / Intra / Post-Procedure Vital Signs  Patient Position:  Sitting  BP:  140/72  Pulse:  95  Resp:  117  Temp:  98.5 F (36.9 C)  SpO2:  98 %   Terance Hart, RN    11:57:00 Vital Signs Other flowsheet entries  Temp src:  TEMPORAL  MAP (mmHg):  94.67  O2 Device:  RA  BP Location:  Left arm  BP Method:  Automatic   Terance Hart, RN    11:57:04 In Preprocedure Status: Pre Procedure   --    11:57:10 In Room Status: Procedure   --    11:57:20 Start of IR documentation     Terance Hart, RN    11:57:34 Staff Arrived Terance Hart, RN [Rad Nurse]; Galen Daft, MD [Radiologist]   Terance Hart, RN    11:57:41 Allergies Reviewed - Review Complete    Terance Hart, RN    11:57:44 History Reviewed Sections Reviewed: Medical, Surgical, Family   Terance Hart, RN    11:57:46 History Reviewed Sections Reviewed: Medical, Surgical, Family   Terance Hart, RN    11:58:45 Sedation/code quick notes MD in room to consult with pt.     Terance Hart, RN    11:59:02 IR Anesthesia Info Anesthesia Information  Anesthesia Type  (Non MU Types):  None   Terance Hart, RN    11:59:08 Sedation Start MD at bedside with pt   Terance Hart, RN    11:59:23 Sedation/code quick notes Interpreter used via phone.     Terance Hart, RN    12:00:00 Assessment Pre-Procedure Checklist  Escorted By:  Self  CONSENT, complete and present?:  No  (consult only)  Interpreter Needed?:  Yes  Language of Care:  Portugese  Identification band on:  Yes  Does the patient have a Pacer or Defibrillator?:  No  N.P.O after midnight:  N/A (comments)  Transport Method:  Ambulatory  Any/All Allergies Doc. Including Latex/Skin Prep:  Yes  Fall Risk/DV   Fall Assessment:  Safe  * Do you feel safe at home?:  Not Asked  Neurological  Neuro (WDL):  Within Defined Limits  Pre / Post-Procedure Pain  Pain Level:   0  Pre-Sedation Confirmation  Emergency Equipment:  Emergency cart; ETCO2; Age appropriate AMBU bag; Oxygen set up; Defibrillator; Pulse oximeter; Intubation tray; Airway - adult; Cardiac Monitor; Suction; Emergency medications (including reversal agents)  Patient's ASA status is identified?:  Yes   Terance Hart, RN    [phone] Sedation/code quick notes MD looking at legs with ultra sound.     Terance Hart, RN    [phone] Sedation/code quick notes MD speaking with pt via interpreter.  Reviewing results of his consult.      Terance Hart, RN    [phone] Sedation End    Terance Hart, RN    [phone] Staff Departed Terance Hart, RN Janyth Pupa Nurse] (Automatically marked out by End of IR Documentation event); Galen Daft, MD [Radiologist] (Automatically marked out by End of IR Documentation event)   Terance Hart, RN    12:28:00 End of IR Documentation Pt had IR consult for EVLT . MD reviewed results of his findings and no further IR intervention needed at this  time. Pt given informaion on compression stockings by MD.   Terance Hart, RN

## 2020-06-20 NOTE — Sedation/code quick notes (Signed)
Interpreter used via phone.

## 2020-06-20 NOTE — Sedation/code quick notes (Signed)
MD speaking with pt via interpreter.  Reviewing results of his consult.

## 2020-06-20 NOTE — Sedation/code quick notes (Signed)
MD in room to consult with pt.

## 2020-06-20 NOTE — Sedation/code quick notes (Signed)
MD looking at legs with ultra sound.

## 2020-07-02 ENCOUNTER — Ambulatory Visit
Admission: RE | Admit: 2020-07-02 | Discharge: 2020-07-02 | Disposition: A | Discharge status: Home / Self Care | Payer: 59 | Attending: Family Medicine | Admitting: Family Medicine

## 2020-07-02 ENCOUNTER — Encounter (HOSPITAL_BASED_OUTPATIENT_CLINIC_OR_DEPARTMENT_OTHER): Payer: Self-pay | Admitting: Family Medicine

## 2020-07-02 ENCOUNTER — Other Ambulatory Visit: Payer: Self-pay

## 2020-07-02 ENCOUNTER — Telehealth (HOSPITAL_BASED_OUTPATIENT_CLINIC_OR_DEPARTMENT_OTHER): Payer: Self-pay | Admitting: Family Medicine

## 2020-07-02 ENCOUNTER — Ambulatory Visit (HOSPITAL_BASED_OUTPATIENT_CLINIC_OR_DEPARTMENT_OTHER): Payer: 59 | Admitting: Family Medicine

## 2020-07-02 VITALS — BP 105/59 | HR 89 | Temp 98.5°F | Ht 60.67 in | Wt 128.8 lb

## 2020-07-02 DIAGNOSIS — B009 Herpesviral infection, unspecified: Secondary | ICD-10-CM

## 2020-07-02 DIAGNOSIS — R42 Dizziness and giddiness: Secondary | ICD-10-CM | POA: Insufficient documentation

## 2020-07-02 DIAGNOSIS — Z8349 Family history of other endocrine, nutritional and metabolic diseases: Secondary | ICD-10-CM | POA: Insufficient documentation

## 2020-07-02 DIAGNOSIS — Z789 Other specified health status: Secondary | ICD-10-CM | POA: Insufficient documentation

## 2020-07-02 DIAGNOSIS — Z0000 Encounter for general adult medical examination without abnormal findings: Secondary | ICD-10-CM

## 2020-07-02 DIAGNOSIS — R413 Other amnesia: Secondary | ICD-10-CM | POA: Insufficient documentation

## 2020-07-02 DIAGNOSIS — I10 Essential (primary) hypertension: Secondary | ICD-10-CM | POA: Insufficient documentation

## 2020-07-02 DIAGNOSIS — K219 Gastro-esophageal reflux disease without esophagitis: Secondary | ICD-10-CM

## 2020-07-02 DIAGNOSIS — Z23 Encounter for immunization: Secondary | ICD-10-CM

## 2020-07-02 DIAGNOSIS — R251 Tremor, unspecified: Secondary | ICD-10-CM

## 2020-07-02 DIAGNOSIS — I83813 Varicose veins of bilateral lower extremities with pain: Secondary | ICD-10-CM | POA: Insufficient documentation

## 2020-07-02 DIAGNOSIS — F321 Major depressive disorder, single episode, moderate: Secondary | ICD-10-CM | POA: Diagnosis present

## 2020-07-02 DIAGNOSIS — H7293 Unspecified perforation of tympanic membrane, bilateral: Secondary | ICD-10-CM | POA: Insufficient documentation

## 2020-07-02 DIAGNOSIS — M1812 Unilateral primary osteoarthritis of first carpometacarpal joint, left hand: Secondary | ICD-10-CM | POA: Diagnosis present

## 2020-07-02 LAB — THYROID SCREEN TSH REFLEX FT4: THYROID SCREEN TSH REFLEX FT4: 1.8 u[IU]/mL (ref 0.358–3.740)

## 2020-07-02 MED ORDER — HYDROCHLOROTHIAZIDE 12.5 MG PO CAPS
12.5000 mg | ORAL_CAPSULE | Freq: Every day | ORAL | 3 refills | Status: DC
Start: 2020-07-02 — End: 2020-09-24

## 2020-07-02 MED ORDER — FAMOTIDINE 20 MG PO TABS
20.0000 mg | ORAL_TABLET | Freq: Two times a day (BID) | ORAL | 3 refills | Status: DC
Start: 2020-07-02 — End: 2020-10-09

## 2020-07-02 MED ORDER — VENLAFAXINE HCL ER 150 MG PO CP24
150.0000 mg | ORAL_CAPSULE | Freq: Every day | ORAL | 3 refills | Status: DC
Start: 2020-07-02 — End: 2020-10-09

## 2020-07-02 MED ORDER — VALACYCLOVIR HCL 1 G PO TABS
ORAL_TABLET | ORAL | 3 refills | Status: DC
Start: 2020-07-02 — End: 2020-09-23

## 2020-07-02 MED ORDER — ACYCLOVIR 5 % EX OINT
TOPICAL_OINTMENT | CUTANEOUS | 1 refills | Status: AC
Start: 2020-07-02 — End: 2020-08-02

## 2020-07-02 MED ORDER — LOSARTAN POTASSIUM 50 MG PO TABS
50.0000 mg | ORAL_TABLET | Freq: Every day | ORAL | 3 refills | Status: DC
Start: 2020-07-02 — End: 2020-07-25

## 2020-07-02 NOTE — Progress Notes (Signed)
CC: physical      #)physical  -scheduled for physical, but has several other things she'd like to talk about    #)memory  -feeling more forgetful  -going on since before COVID but worse since    #)smell  -still hasn't really returned since COVID    #)tremor  -tremor in left hand at times  -fam hx of Parkinson's  -concerned about this    #)thyroid  -most of her siblings have thyroid issues  -would like a test for this    #)dizziness  -has history of labyrinthitis  -takes betahistine derivative from [place]  -took another medicine several years ago    #)legs  -works on Health visitor, works at [employer]  -legs are sore   -some days are ok, some days aren't  -if wears compression socks, feels better but these are very hot  -sometimes has to rest and raise legs  -has varicose veins    #)herpes  -has herpes flare when stressed out  -gets them on buttock  -itchy at first, then swell, painful    #)depression  -would like to increase venlafaxine to 150mg      #)acid reflux  -would like refill of pepcid, this was helpful previously    #)finger pain  -pain in index and ring finger of left hand    ROS:  -no fever  -no cough    O:  BP 105/59    Pulse 89    Temp 98.5 F (36.9 C) (Temporal)    Ht 5' 0.67" (1.541 m)    Wt 58.4 kg (128 lb 12.8 oz)    SpO2 96%    BMI 24.60 kg/m   Gen: NAD  HEENT: MMM, bilateral TM perforation noted  CV: S1, S2, RRR  Pulm: CTAB  Psych: normal speech and thought  Skin: no rash  MSK: left hand with some swelling noted along second and 4th digits-> tender to palpation    (R42) Dizziness  (primary encounter diagnosis)  Comment: starting HCTZ to see if this helps, pt has hx of labyrinthitis   Plan: hydrochlorothiazide (MICROZIDE) 12.5 MG capsule            (Z00.00) Healthcare maintenance  Comment:   Plan: THYROID SCREEN TSH REFLEX FT4, XR HAND LEFT         MINIMUM 3 VIEWS, XR DXA BONE DENSITOMETRY            (Z83.49) Family history of thyroid disorder  Comment: checking TSH  Plan:     ) Pain due  to varicose veins of both lower extremities  Comment: rec'd trial of horse chestnut extract  Plan:     (B00.9) Herpes simplex virus (HSV) infection  Comment: Rx for valtrex and acyclovir ointment provided, use at first sign of sx  Plan: valACYclovir HCl (VALTREX) 1 g tablet,         acyclovir (ZOVIRAX) 5 % ointment            (R25.1) Tremor of left hand  (R41.3) Memory change  Comment: covered so many things did not have time to address these in good detail  Plan:     (Z23) Need for prophylactic vaccination with combined diphtheria-tetanus-pertussis (DTP) vaccine  Comment:   Plan: TDAP VACCINE 7 AND OLDER IM, IMMUNIZATION ADMIN        EACH ADD            (Z23) Need for prophylactic vaccination against Streptococcus pneumoniae (pneumococcus)  Comment:   Plan: IMMUNIZATION ADMIN SINGLE,  PCV13 VACCINE FOR         INTRAMUSCULAR USE            (Z78.9) Patient has healthcare proxy  Comment:   Plan: HEALTH CARE PROXY          #)finger pain  -XR ordered    (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

## 2020-07-02 NOTE — Telephone Encounter (Signed)
NURSE  from [facility]  called the Central Refill Department to complete a benefit analysis for the T DAP  Vaccine.      The vaccine is covered under the patients [place]  medical coverage.            Please choose Private  3

## 2020-07-02 NOTE — Progress Notes (Signed)
07/02/2020  VIS given prior to administration and reviewed with the patient and or legal guardian. Patient understands the disease and the vaccine. See immunization/Injection module or chart review for date of publication and additional information.  Kemper Heupel N. Ledell Codrington, LPN

## 2020-07-02 NOTE — Telephone Encounter (Signed)
NURSE  from [facility]  called the Central Refill Department to complete a benefit analysis for the PCV-13 Vaccine.      The vaccine is covered under the patient’s [place]  medical coverage.            Please choose Private

## 2020-07-04 ENCOUNTER — Encounter (HOSPITAL_BASED_OUTPATIENT_CLINIC_OR_DEPARTMENT_OTHER): Payer: Self-pay | Admitting: Family Medicine

## 2020-07-04 DIAGNOSIS — H7293 Unspecified perforation of tympanic membrane, bilateral: Secondary | ICD-10-CM | POA: Insufficient documentation

## 2020-07-25 ENCOUNTER — Other Ambulatory Visit: Payer: Self-pay

## 2020-07-25 ENCOUNTER — Ambulatory Visit: Payer: 59 | Attending: Family Medicine | Admitting: Family Medicine

## 2020-07-25 ENCOUNTER — Encounter (HOSPITAL_BASED_OUTPATIENT_CLINIC_OR_DEPARTMENT_OTHER): Payer: Self-pay | Admitting: Family Medicine

## 2020-07-25 VITALS — BP 100/60 | HR 85 | Temp 98.2°F | Ht 60.75 in | Wt 127.8 lb

## 2020-07-25 DIAGNOSIS — R42 Dizziness and giddiness: Secondary | ICD-10-CM | POA: Insufficient documentation

## 2020-07-25 DIAGNOSIS — R251 Tremor, unspecified: Secondary | ICD-10-CM | POA: Diagnosis present

## 2020-07-25 DIAGNOSIS — I10 Essential (primary) hypertension: Secondary | ICD-10-CM

## 2020-07-25 DIAGNOSIS — M7918 Myalgia, other site: Secondary | ICD-10-CM

## 2020-07-25 DIAGNOSIS — M19042 Primary osteoarthritis, left hand: Secondary | ICD-10-CM | POA: Diagnosis present

## 2020-07-25 MED ORDER — DICLOFENAC SODIUM 1 % EX GEL
2.0000 g | Freq: Four times a day (QID) | CUTANEOUS | 1 refills | Status: DC
Start: 2020-07-25 — End: 2020-10-15

## 2020-07-25 MED ORDER — LOSARTAN POTASSIUM 25 MG PO TABS
25.0000 mg | ORAL_TABLET | Freq: Every day | ORAL | 3 refills | Status: DC
Start: 2020-07-25 — End: 2020-10-09

## 2020-07-25 MED ORDER — DICLOFENAC POTASSIUM 50 MG PO TABS
50.0000 mg | ORAL_TABLET | Freq: Three times a day (TID) | ORAL | 0 refills | Status: DC | PRN
Start: 2020-07-25 — End: 2021-01-16

## 2020-07-25 NOTE — Progress Notes (Signed)
CC: f/u several things    #)dizziness  -has improved significantly  -taking HCTZ daily    #)tremor  -is better    #)hands  -reviewed XR    #)BP  -is a little low    #)low back pain  -going on for about a week  -started after helping a friend clean a house  -feels pain when takes a deep breath  -took medicine that is combo of soma, tylenol, diclofenac, caffeine with minimal effect      ROS:  -no fever  -no cough    O:  BP 100/60    Pulse 85    Temp 98.2 F (36.8 C) (Temporal)    Ht 5' 0.75" (1.543 m)    Wt 58 kg (127 lb 12.8 oz)    SpO2 95%    BMI 24.35 kg/m   Gen: NAD  HEENT: MMM  CV: S1, S2, RRR  Pulm: CTAB  Psych: normal speech and thought  Skin: no rash  MSK: some pain in right lower back and in right buttock. 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

## 2020-07-25 NOTE — Patient Instructions (Signed)
Patient Education   Index Spanish     Adult Advisor 2019.4 published by [employer].  Last reviewed: 2016-04-21

## 2020-08-30 ENCOUNTER — Ambulatory Visit: Payer: 59 | Attending: Ophthalmology | Admitting: Optometrist

## 2020-08-30 ENCOUNTER — Other Ambulatory Visit: Payer: Self-pay

## 2020-08-30 DIAGNOSIS — H52203 Unspecified astigmatism, bilateral: Secondary | ICD-10-CM | POA: Insufficient documentation

## 2020-08-30 DIAGNOSIS — H04123 Dry eye syndrome of bilateral lacrimal glands: Secondary | ICD-10-CM | POA: Diagnosis not present

## 2020-08-30 DIAGNOSIS — H524 Presbyopia: Secondary | ICD-10-CM | POA: Insufficient documentation

## 2020-08-30 DIAGNOSIS — H5203 Hypermetropia, bilateral: Secondary | ICD-10-CM | POA: Diagnosis not present

## 2020-08-30 DIAGNOSIS — H2513 Age-related nuclear cataract, bilateral: Secondary | ICD-10-CM | POA: Insufficient documentation

## 2020-08-30 NOTE — Progress Notes (Signed)
I had a pleasure of seeing  Wendy Flores at the [facility] on 08/30/20. The summary of findings of the comprehensive eye exam is as follows:     Assessment:     1. Hyperopia with astigmatism OU, presbyopia     2. Dry eye syndrome OU    3. 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 Patient edu on findings and s/sx associated with presbyopia. Discussed options for correction.     2. Patient edu on findings and use of OTC artificial tears up to QID OU. Also advised on use of lid scrubs with baby shampoo and warm compresses daily.    3. Patient edu on findings and signs and symptoms associated with cataracts. Continue monitoring at this time. Consider referral to MD for cataract eval if progression noted       Return to clinic in 1-2 years for comprehensive eye exam.

## 2020-08-30 NOTE — Patient Instructions (Addendum)
Tratamento para olhos secos:  - Compressas quentes (pano de rosto quente ou máscaras faciais aquecidas, tais como Bruder/Tranquileyes/Thera-Pearl) mantidas sobre os olhos duas vezes ao dia, por 5 a 10 minutos. Elas ajudarão a aliviar o entupimento das glândulas e recuperar a função das glândulas sebáceas nas pálpebras para produzir lágrimas de melhor qualidade. Também podem massagear delicadamente as pálpebras conforme você faz as compressas quentes.    - Utilize lágrimas artificiais de 3 a 4 vezes ao dia em ambos os olhos para aliviar a queimação, coceira e irritação. Esse produto pode ser adquirido sem prescrição, de marcas como: Refresh, Systane, TheraTears, Blink, Soothe. Não utilize marcas como Visine, Roto, Clear Eyes ou quaisquer colírios destinados a "eliminar a vermelhidão", pois eles agravarão os sintomas.     - Soluções de limpeza das pálpebras com shampoo de bebê diluído ajudarão a manter as pálpebras livres de quaisquer agentes irritantes sem causar ardor aos olhos durante a lavagem dos cílios.     Para ajudar a ler de perto as letras miúdas, há muitas opções para correção visual. Essas opções incluem:  - Alternar entre um par de óculos com prescrição só para longe e um par de óculos com prescrição só para perto, colocando-os e tirando-os na distância apropriada.   - Bifocais com a linha de divisão, que teriam a prescrição para longe na parte superior da lente (acima da linha) e a prescrição para perto ou para leitura abaixo da linha. Não há uma faixa para se trabalhar com o computador nas lentes bifocais com linha.   - Lentes progressivas, que são um tipo de bifocais sem a linha. O efeito das lentes progressivas começa com a prescrição para longe na parte superior da lente fazendo uma transição progressiva para baixo da lente, aumentando a capacidade de leitura. No meio da lente existe uma faixa perfeita para se trabalhar com o computador.     **Sua receita é escrita para que você possa fazer qualquer  uma das opções acima.    **Para fazer seus óculos, você pode levar sua receita para a ótica da [place] em [place] (informações na parte inferior da receita)     Senão, você pode levar a receita para qualquer outra ótica juntamente com o seu cartão de seguro. Certifique-se de que a ótica aceita seu seguro se você tiver cobertura para óculos.   Outros pontos de venda de produtos óticos: [facility], [facility], [facility], e algumas unidades do [facility].  For glasses you can bring your prescription to the [facility] in [place] (information on the bottom of the prescription)      Otherwise, you can bring the paper to any other optical shop along with your insurance card. Do make sure the optical shop accepts your insurance if you have coverage for glasses.     [facility]   [address]  [place], [place] [address]  (Ph) [phone]    [facility]   [address]  [place], [place] [address]  (Ph) [phone]    [facility]  [address]   [place], [place] [address]  (Ph) [phone]    [facility]  [address]  [place], [place] [address]  (Ph) [phone]    [facility]   [address]   [place], [place] [address]  (Ph) [phone]    [facility]  [address]  [place], [place]   (Ph) [phone]    [facility]   [address]   [place], [place]  [address]  (  Ph) [phone]     [facility]   [address]   [place], [place] [address]  (Ph) [phone]     [facility]   [address]  [place], [place] [address]  (Ph) [phone]

## 2020-09-02 ENCOUNTER — Other Ambulatory Visit (HOSPITAL_BASED_OUTPATIENT_CLINIC_OR_DEPARTMENT_OTHER): Payer: Self-pay

## 2020-09-23 ENCOUNTER — Other Ambulatory Visit (HOSPITAL_BASED_OUTPATIENT_CLINIC_OR_DEPARTMENT_OTHER): Payer: Self-pay | Admitting: Family Medicine

## 2020-09-23 DIAGNOSIS — B009 Herpesviral infection, unspecified: Secondary | ICD-10-CM

## 2020-09-23 NOTE — Telephone Encounter (Signed)
PER Pharmacy, Wendy Flores is a 69 year old female has requested a refill of      -  valACYclovir        Last Office Visit: 07/25/20 with pcp  Last Physical Exam: 12/16/17     There are no preventive care reminders to display for this patient.     Other Med Adult:  Most Recent BP Reading(s)  07/25/20 : 100/60        Cholesterol (mg/dL)   Date Value   01/20/3142 170     LOW DENSITY LIPOPROTEIN DIRECT (mg/dL)   Date Value   88/87/5797 80     HIGH DENSITY LIPOPROTEIN (mg/dL)   Date Value   28/20/6015 52     TRIGLYCERIDES (mg/dL)   Date Value   61/53/7943 184 (H)         THYROID SCREEN TSH REFLEX FT4 (uIU/mL)   Date Value   07/02/2020 1.800         No results found for: TSH    No results found for: HGBA1C    No results found for: POCA1C      No results found for: INR    SODIUM (mmol/L)   Date Value   12/16/2017 142       POTASSIUM (mmol/L)   Date Value   12/16/2017 4.4           CREATININE (mg/dL)   Date Value   27/61/4709 0.6        Documented patient preferred pharmacies:    [id number] [name], [place] - [address]  Phone: [phone] Fax: [phone]

## 2020-09-24 ENCOUNTER — Other Ambulatory Visit (HOSPITAL_BASED_OUTPATIENT_CLINIC_OR_DEPARTMENT_OTHER): Payer: Self-pay | Admitting: Family Medicine

## 2020-09-24 DIAGNOSIS — R42 Dizziness and giddiness: Secondary | ICD-10-CM

## 2020-09-24 NOTE — Telephone Encounter (Signed)
PER Pharmacy, Wendy Flores is a 69 year old female has requested a refill of    -Hydrochlorothiazide       Last Office Visit: 07/25/20 with pcp  Last Physical Exam: 12/16/17    There are no preventive care reminders to display for this patient.    Other Med Adult:  Most Recent BP Reading(s)  07/25/20 : 100/60            Cholesterol (mg/dL)   Date Value   14/44/5848 170         LOW DENSITY LIPOPROTEIN DIRECT (mg/dL)   Date Value   35/05/5731 80         HIGH DENSITY LIPOPROTEIN (mg/dL)   Date Value   25/67/2091 52         TRIGLYCERIDES (mg/dL)   Date Value   98/12/2177 184 (H)             THYROID SCREEN TSH REFLEX FT4 (uIU/mL)   Date Value   07/02/2020 1.800         No results found for: TSH    No results found for: HGBA1C    No results found for: POCA1C      No results found for: INR        SODIUM (mmol/L)   Date Value   12/16/2017 142           POTASSIUM (mmol/L)   Date Value   12/16/2017 4.4               CREATININE (mg/dL)   Date Value   81/12/5484 0.6       Documented patient preferred pharmacies:    [id number] - [name], [place] - [address]  Phone: [phone] Fax: [phone]

## 2020-10-09 ENCOUNTER — Ambulatory Visit: Payer: 59 | Attending: Family Medicine | Admitting: Family Medicine

## 2020-10-09 ENCOUNTER — Encounter (HOSPITAL_BASED_OUTPATIENT_CLINIC_OR_DEPARTMENT_OTHER): Payer: Self-pay | Admitting: Family Medicine

## 2020-10-09 ENCOUNTER — Other Ambulatory Visit: Payer: Self-pay

## 2020-10-09 ENCOUNTER — Other Ambulatory Visit (HOSPITAL_BASED_OUTPATIENT_CLINIC_OR_DEPARTMENT_OTHER): Payer: Self-pay | Admitting: Physician Assistant

## 2020-10-09 VITALS — BP 115/68 | HR 88 | Wt 128.0 lb

## 2020-10-09 DIAGNOSIS — I10 Essential (primary) hypertension: Secondary | ICD-10-CM

## 2020-10-09 DIAGNOSIS — Z0000 Encounter for general adult medical examination without abnormal findings: Secondary | ICD-10-CM | POA: Diagnosis not present

## 2020-10-09 DIAGNOSIS — R42 Dizziness and giddiness: Secondary | ICD-10-CM | POA: Diagnosis present

## 2020-10-09 DIAGNOSIS — M19042 Primary osteoarthritis, left hand: Secondary | ICD-10-CM

## 2020-10-09 DIAGNOSIS — E7800 Pure hypercholesterolemia, unspecified: Secondary | ICD-10-CM

## 2020-10-09 DIAGNOSIS — R251 Tremor, unspecified: Secondary | ICD-10-CM | POA: Diagnosis present

## 2020-10-09 DIAGNOSIS — K219 Gastro-esophageal reflux disease without esophagitis: Secondary | ICD-10-CM | POA: Insufficient documentation

## 2020-10-09 DIAGNOSIS — Z23 Encounter for immunization: Secondary | ICD-10-CM | POA: Diagnosis present

## 2020-10-09 DIAGNOSIS — F321 Major depressive disorder, single episode, moderate: Secondary | ICD-10-CM

## 2020-10-09 DIAGNOSIS — Z833 Family history of diabetes mellitus: Secondary | ICD-10-CM | POA: Insufficient documentation

## 2020-10-09 MED ORDER — FAMOTIDINE 20 MG PO TABS
20.0000 mg | ORAL_TABLET | Freq: Two times a day (BID) | ORAL | 3 refills | Status: DC
Start: 2020-10-09 — End: 2020-12-17

## 2020-10-09 MED ORDER — PANTOPRAZOLE SODIUM 40 MG PO TBEC
40.00 mg | DELAYED_RELEASE_TABLET | Freq: Every day | ORAL | 0 refills | Status: AC
Start: 2020-10-09 — End: 2020-10-23

## 2020-10-09 MED ORDER — HYDROCHLOROTHIAZIDE 12.5 MG PO CAPS
ORAL_CAPSULE | ORAL | 3 refills | Status: DC
Start: 2020-10-09 — End: 2021-04-08

## 2020-10-09 MED ORDER — VENLAFAXINE HCL ER 150 MG PO CP24
150.0000 mg | ORAL_CAPSULE | Freq: Every day | ORAL | 3 refills | Status: DC
Start: 2020-10-09 — End: 2021-04-08

## 2020-10-09 NOTE — Progress Notes (Signed)
CC: dizziness    #)dizziness  -worsened again  -about two weeks ago HCTZ ran out  -having significant dizziness    #)blood sugar  -mother had DM, interested in test    #)acid reflux  -worsening again  -needs to take famotidine three times per day sometimes  -avoids fried foods, other triggers  -notices that if she doesn't eat for 3 hours or so, starts to get worse acid  -using tums every day    #)tremor  -left hand mainly  -occurs a few days per week  -not right now  -does not prevent from writing or doing tasks with hands  -concerned as family member as parkinsons    #)hand pain  -better with voltaren gel  -feels like arthritic finger is more straight now    ROS:  -some cold sx    O:  BP 115/68    Pulse 88    Wt 58.1 kg (128 lb)    BMI 24.39 kg/m   Gen: NAD  HEENT: MMM  Pulm: normal effort  Psych: normal speech and thought  Skin: no rash  Neuro: no tremor noted today      (R42) Dizziness  (primary encounter diagnosis)  Comment: worsened after HCTZ stopped, re-sending to pharmacy with more refills. Do continue to suspect labyrinthitis  Plan: hydrochlorothiazide (MICROZIDE) 12.5 MG capsule            (F32.1) Current moderate episode of major depressive disorder without prior episode (HCC)  Comment: refilled venlafaxine  Plan: venlafaxine (EFFEXOR-XR) 150 MG 24 hr capsule            (Z00.00) Healthcare maintenance  Comment:   Plan: HEMOGLOBIN A1C            (I10) Essential hypertension  Comment: stopping losartan, will treat just with HCTZ  Plan: BASIC METABOLIC PANEL            (K21.9) Gastroesophageal reflux disease, unspecified whether esophagitis present  Comment: checking for parasites. Advised to stop TUMS. Use protonix for 14 days, then switch back to famotidine. 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 Low concern for parkinsons. Will monitor  Plan:       I spent a total of 40 minutes on this visit on the date of service (total time includes all activities performed on the date of service)

## 2020-10-09 NOTE — Telephone Encounter (Signed)
PER Pharmacy, Wendy Flores is a 69 year old female has requested a refill of      -  simvastatin        Last Office Visit: 07/25/20 with pcp  Last Physical Exam: 12/16/17     There are no preventive care reminders to display for this patient.     Other Med Adult:  Most Recent BP Reading(s)  07/25/20 : 100/60        Cholesterol (mg/dL)   Date Value   38/33/3832 170     LOW DENSITY LIPOPROTEIN DIRECT (mg/dL)   Date Value   91/91/6606 80     HIGH DENSITY LIPOPROTEIN (mg/dL)   Date Value   00/45/9977 52     TRIGLYCERIDES (mg/dL)   Date Value   41/42/3953 184 (H)         THYROID SCREEN TSH REFLEX FT4 (uIU/mL)   Date Value   07/02/2020 1.800         No results found for: TSH    No results found for: HGBA1C    No results found for: POCA1C      No results found for: INR    SODIUM (mmol/L)   Date Value   12/16/2017 142       POTASSIUM (mmol/L)   Date Value   12/16/2017 4.4           CREATININE (mg/dL)   Date Value   20/23/3435 0.6        Documented patient preferred pharmacies:    [id number] [name], [place] - [address]  Phone: [phone] Fax: [phone]

## 2020-10-09 NOTE — Progress Notes (Signed)
Influenza Vaccine Procedure  October 09, 2020    1. Has the patient received the information for the influenza vaccine? Yes    2. Does the patient have any of the following contraindications?  Allergy to eggs? No  Allergic reaction to previous influenza vaccines? No  Any other problems to previous influenza vaccines? No  Paralyzed by Guillain-Barre syndrome?  No  Current moderate or severe illness? No  Allergy to contact lens solution? No    3. The vaccine has been administered in the usual fashion.     Immunization information reviewed. Current VIS reviewed and given to patient/ guardian. Verbal assent obtained from patient/ guardian.  See immunization/Injection module or chart review for date of publication and additional information. Verbal assent obtained from patient/guardian. Comfort measures for possible side effects reviewed.

## 2020-10-10 LAB — HEMOGLOBIN A1C
ESTIMATED AVERAGE GLUCOSE: 128 mg/dL (ref 74–160)
HEMOGLOBIN A1C: 6.1 % — ABNORMAL HIGH (ref 4.0–5.6)

## 2020-10-13 LAB — HELICOBACTER PYLORI IGG: HELICOBACTER PYLORI IGG: 0.31 (ref 0.00–0.79)

## 2020-10-15 ENCOUNTER — Other Ambulatory Visit (HOSPITAL_BASED_OUTPATIENT_CLINIC_OR_DEPARTMENT_OTHER): Payer: Self-pay | Admitting: Family Medicine

## 2020-10-15 LAB — SCHISTOSOMAL AB IGG: SCHISTOSOMAL AB IgG: 8.2 INDEX (ref 0.00–8.99)

## 2020-10-15 LAB — BASIC METABOLIC PANEL
ANION GAP: 5 mmol/L (ref 5–15)
BUN (UREA NITROGEN): 14 mg/dL (ref 7–18)
CALCIUM: 9.5 mg/dL (ref 8.5–10.1)
CARBON DIOXIDE: 31 mmol/L (ref 21–32)
CHLORIDE: 106 mmol/L (ref 98–107)
CREATININE: 0.7 mg/dL (ref 0.4–1.2)
ESTIMATED GLOMERULAR FILT RATE: >60 mL/min (ref >60)
Glucose Random: 107 mg/dL (ref 74–160)
POTASSIUM: 4.5 mmol/L (ref 3.5–5.1)
SODIUM: 142 mmol/L (ref 136–145)

## 2020-10-15 LAB — STRONGYLOIDES AB IGG: STRONGYLOIDES AB IgG: 0.32 INDEX (ref 0.00–9.99)

## 2020-10-15 NOTE — Telephone Encounter (Signed)
PER Pharmacy, Wendy Flores is a 69 year old female has requested a refill of     -  DICLOFENAC GEL       Last Office Visit: 10/09/2020 with PCP  Last Physical Exam: 12/16/2017    There are no preventive care reminders to display for this patient.    Other Med Adult:  Most Recent BP Reading(s)  10/09/20 : 115/68        Cholesterol (mg/dL)   Date Value   07/86/7544 170     LOW DENSITY LIPOPROTEIN DIRECT (mg/dL)   Date Value   92/11/69 80     HIGH DENSITY LIPOPROTEIN (mg/dL)   Date Value   21/97/5883 52     TRIGLYCERIDES (mg/dL)   Date Value   25/49/8264 184 (H)         THYROID SCREEN TSH REFLEX FT4 (uIU/mL)   Date Value   07/02/2020 1.800         No results found for: TSH    HEMOGLOBIN A1C (%)   Date Value   10/09/2020 6.1 (H)       No results found for: POCA1C      No results found for: INR    SODIUM (mmol/L)   Date Value   10/09/2020 142       POTASSIUM (mmol/L)   Date Value   10/09/2020 4.5           CREATININE (mg/dL)   Date Value   15/83/0940 0.7       Documented patient preferred pharmacies:    [facility] [name], [place] - [address]  Phone: [phone] Fax: [phone]

## 2020-10-25 ENCOUNTER — Other Ambulatory Visit (HOSPITAL_BASED_OUTPATIENT_CLINIC_OR_DEPARTMENT_OTHER): Payer: Self-pay | Admitting: Physician Assistant

## 2020-10-25 NOTE — Telephone Encounter (Signed)
PER Pharmacy, Wendy Flores is a 69 year old female has requested a refill of effexor.    was discontinued on 07/02/2020       Last Office Visit: 10/09/2020 with Frazier Richards  Last Physical Exam: 1/25/219      Other Med Adult:  Most Recent BP Reading(s)  10/09/20 : 115/68        Cholesterol (mg/dL)   Date Value   16/08/9603 170     LOW DENSITY LIPOPROTEIN DIRECT (mg/dL)   Date Value   54/07/8118 80     HIGH DENSITY LIPOPROTEIN (mg/dL)   Date Value   14/78/2956 52     TRIGLYCERIDES (mg/dL)   Date Value   21/30/8657 184 (H)         THYROID SCREEN TSH REFLEX FT4 (uIU/mL)   Date Value   07/02/2020 1.800         No results found for: TSH    HEMOGLOBIN A1C (%)   Date Value   10/09/2020 6.1 (H)       No results found for: POCA1C      No results found for: INR    SODIUM (mmol/L)   Date Value   10/09/2020 142       POTASSIUM (mmol/L)   Date Value   10/09/2020 4.5           CREATININE (mg/dL)   Date Value   84/69/6295 0.7       Documented patient preferred pharmacies:    [facility] [name], [place] - [address]  Phone: [phone] Fax: [phone]

## 2020-12-17 ENCOUNTER — Other Ambulatory Visit: Payer: Self-pay

## 2020-12-17 ENCOUNTER — Encounter (HOSPITAL_BASED_OUTPATIENT_CLINIC_OR_DEPARTMENT_OTHER): Payer: Self-pay | Admitting: Family Medicine

## 2020-12-17 ENCOUNTER — Ambulatory Visit (HOSPITAL_BASED_OUTPATIENT_CLINIC_OR_DEPARTMENT_OTHER): Payer: 59 | Admitting: Family Medicine

## 2020-12-17 ENCOUNTER — Ambulatory Visit
Admission: RE | Admit: 2020-12-17 | Discharge: 2020-12-17 | Disposition: A | Discharge status: Home / Self Care | Payer: 59

## 2020-12-17 VITALS — BP 114/63 | HR 81 | Temp 98.8°F | Wt 128.4 lb

## 2020-12-17 DIAGNOSIS — S52611K Displaced fracture of right ulna styloid process, subsequent encounter for closed fracture with nonunion: Secondary | ICD-10-CM | POA: Insufficient documentation

## 2020-12-17 DIAGNOSIS — K219 Gastro-esophageal reflux disease without esophagitis: Secondary | ICD-10-CM | POA: Insufficient documentation

## 2020-12-17 DIAGNOSIS — N951 Menopausal and female climacteric states: Secondary | ICD-10-CM

## 2020-12-17 DIAGNOSIS — I10 Essential (primary) hypertension: Secondary | ICD-10-CM | POA: Diagnosis not present

## 2020-12-17 DIAGNOSIS — M19041 Primary osteoarthritis, right hand: Secondary | ICD-10-CM

## 2020-12-17 DIAGNOSIS — R232 Flushing: Secondary | ICD-10-CM | POA: Diagnosis not present

## 2020-12-17 DIAGNOSIS — M778 Other enthesopathies, not elsewhere classified: Secondary | ICD-10-CM | POA: Insufficient documentation

## 2020-12-17 LAB — RBC SEDIMENTATION RATE: RBC SEDIMENTATION RATE: 5 MM/HR (ref 0–30)

## 2020-12-17 MED ORDER — ESTRADIOL 0.1 MG/GM VA CREA
TOPICAL_CREAM | VAGINAL | 5 refills | Status: AC
Start: 2020-12-17 — End: 2021-06-16

## 2020-12-17 MED ORDER — FAMOTIDINE 20 MG PO TABS
20.0000 mg | ORAL_TABLET | Freq: Two times a day (BID) | ORAL | 3 refills | Status: DC
Start: 2020-12-17 — End: 2021-04-08

## 2020-12-17 MED ORDER — DICLOFENAC SODIUM 75 MG PO TBEC
75.0000 mg | DELAYED_RELEASE_TABLET | Freq: Two times a day (BID) | ORAL | 1 refills | Status: DC
Start: 2020-12-17 — End: 2021-01-15

## 2020-12-17 NOTE — Progress Notes (Signed)
SUBJECTIVE:  Wendy Flores is a 70 year old female who presents with right hand pain x5 days and chronic post-menopausal symptoms.    Hand Pain  - About 5 days of right hand pain, worsening over those days. Pain and swelling on the back of her hand.  - Yesterday night and this morning she used Arnica and PO Voltaren pill she got from [place], which improved her symptoms significantly.  - States she used heat for the 5 days and didn't find much relief.  - She works at [employer] and found the pain was worsening over time.  - Denies any inciting trauma to the hand.  - Endorses mild intermittent bilateral knee pain for about 4 years, and in the past had right shoulder and elbow pain that resolved about 3 years prior.  - She also has had swelling and pain in #3 of her left hand, treated with Voltaren gel which she states is not bothering her today and has improved over time with treatment.  - Endorses a FHx of grandmother with "rheumatism" in [place], describes her as having very "twisted joints" and experiencing pain from this.    Post-Menopausal symptoms  - Hot flashes x20-30 years. She is interested in treatment but prefers not to take hormonal medications.  - Describes episodes of feeling "very hot all over" for about 5 minutes before it passes.  - States she doesn't feel them every day.  - She has also had vaginal dryness for about 30-40 years. No bleeding with intercourse or dyspareunia but she wonderss about options for treatment of this as well.    ROS  - No fever  - No weight loss  - No falls or physical trauma.  - No frank GI bleeding or dark stools.    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     Colon polyp     Diverticulosis of colon     Essential hypertension     Pure hypercholesterolemia     Current moderate episode of major depressive disorder without prior episode (HCC)     Tympanic membrane perforation,  bilateral      MEDICATIONS: simvastatin (ZOCOR) 20 MG tablet, TAKE 1 TABLET BY MOUTH EVERY DAY AT NIGHT, Disp: 90 tablet, Rfl: 3  venlafaxine (EFFEXOR-XR) 150 MG 24 hr capsule, Take 1 capsule by mouth daily, Disp: 90 capsule, Rfl: 3  hydrochlorothiazide (MICROZIDE) 12.5 MG capsule, TAKE 1 CAPSULE BY MOUTH EVERY DAY, Disp: 90 capsule, Rfl: 3  famotidine (PEPCID) 20 MG tablet, Take 1 tablet by mouth 2 (two) times daily, Disp: 180 tablet, Rfl: 3  diclofenac (VOLTAREN) 50 MG tablet, Take 1 tablet by mouth 3 (three) times daily as needed, Disp: 60 tablet, Rfl: 0    No current facility-administered medications on file prior to visit.     ALLERGIES:  Review of Patient's Allergies indicates:  No Known Allergies    OBJECTIVE:    VITALS:  BP 114/63    Pulse 81    Temp 98.8 F (37.1 C) (Temporal)    Wt 58.2 kg (128 lb 6.4 oz)    SpO2 98%    Breastfeeding No    BMI 24.46 kg/m   General: Well appearing, no apparent distress.  Eyes: EOMI  Neck: supple  Heart: regular rate and rhythm, normal S1 and S2, no murmurs, rubs or gallops.  Lungs:  Clear to auscultation bilaterally, no wheezing or rales  Abdomen: soft, non-tender, non-distended, no masses palpated  Skin: No rashes, lesions or atypical nevi  Neuro: Cranial nerves intact, grossly normal strength and sensation. Normal gait  Extremities: warm, well perfused, no edema or cyanosis.   Right hand exam: Small area of erythema, warmth, and effusion noted over MCP joints of #2 and #3 of right hand, tender to palpation. MCP joints with subtle deviations/deformity. ROM and strength intact.  Left hand exam: Mild swan neck deformity of #3. Otherwise normal exam.  Psych: Normal affect, normal insight and judgment.     ASSESSMENT/PLAN:   PROBLEMS:   Wendy Flores is a 70 year old female here for:      1. Localized osteoarthritis of right hand  Symptoms and exam, as above, suggestive of possible acute worsening of hand OA already observed on left side. Symptoms improved with  antiinflammatory therapy and topical cream, suggesting an inflammatory etiology. Exam findings not consistant with superficial cellulitis or other infectious etiology. Due to family history of rheumatism and deviations/deformity of the affected hand on exam, will check lab studies for RA and pursue radiographs as well for further evaluation.  Plan:  - Continue Voltaren 75 mg PO BID for 1 week then advised to d/c regular use and take PRN. Can also continue Arnica cream as needed.  - C-REACTIVE PROTEIN  - RBC SEDIMENTATION RATE  - CYCLIC CITRULLIN PEPTIDE IGG  - RHEUMATOID FACTOR  - XR HAND RIGHT MINIMUM 3 VIEWS; Future    2. Essential hypertension  Well controlled on current regimen.    3. Vaginal dryness, menopausal  4. Hot flashes  Experiencing post-menopausal symptoms chronically for 20-30 years s/p Hysterectomy in her late 36's.  Plan:  - For hot flashes, she prefers to avoid use of hormonal Rx due to risks which we fully agree with. For conservative measures to help w/ these, suggested use of portable fan and layer clothing.  - For vaginal dryness, estradiol (ESTRACE) 0.1 MG/GM vaginal cream 2g nightly for 2 weeks, then 2x/week for maintenance. Also suggested use of OTC Replenz 2x/week and [place] Jelly as needed for dryness during intercourse.    5. Gastroesophageal reflux disease  Refilled famotidine (PEPCID) 20 MG tablet.        1. The patient indicates understanding of these issues and agrees with the plan.   No apparent learning barriers were identified. I attempted to answer any questions regarding the diagnosis and the proposed treatment.  2.  The patient is given an After Visit Summary sheet that lists all of their medications with directions, their allergies, orders placed during this encounter, and follow- up instructions.  3. I reviewed the patient's medical information, allergies and medications. I reviewed the potential side effects of any new medications prescribed.     Electronically signed by: Randolph Bing, PA-C, 12/17/2020 2:11 PM  This note is electronically signed in the electronic medical record.     I spent a total of 39 minutes on this visit on the date of service (total time includes all activities performed on the date of service)

## 2020-12-18 LAB — CYCLIC CITRULLIN PEPTIDE IGG: CYCLIC CITRULLIN PEPTIDE IgG: <0.5 U/mL (ref 0.0–2.9)

## 2020-12-18 NOTE — Progress Notes (Signed)
Saw and examined pt with PA-C.   Possible RA based on MCP swelling and pain and AM stiffness. Check labs/xray.   Nsaids.   Mare Ferrari, MD

## 2020-12-19 LAB — C-REACTIVE PROTEIN: C-REACTIVE PROTEIN: <0.3 mg/dL (ref 0.0–1.8)

## 2020-12-19 LAB — RHEUMATOID FACTOR: RHEUMATOID FACTOR: <10 IU/mL (ref <14.0)

## 2020-12-22 ENCOUNTER — Telehealth (HOSPITAL_BASED_OUTPATIENT_CLINIC_OR_DEPARTMENT_OTHER): Payer: Self-pay

## 2020-12-22 NOTE — Telephone Encounter (Signed)
Called patient x3 attempting to discuss results of her imaging and labs. Unfortunately, Pt was unable to answer the phone on these occasions.    - Left a message informing her of negative results of her rheumatoid arthritis labs, reassuring that based on these results and those of her radiographs, she likely does not have rheumatoid arthritis.  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  - Advised her to continue with NSAID course and continue with icing and elevation of the affected hand.  - Suggested that if NSAIDs resolve symptoms, we can continue to monitor this in the future and consider a future referral to Rheumatology if she has another similar episode for further evaluation of possible CPPD.    Provider proficient in [place]; no interpreter was used.

## 2021-01-13 ENCOUNTER — Other Ambulatory Visit (HOSPITAL_BASED_OUTPATIENT_CLINIC_OR_DEPARTMENT_OTHER): Payer: Self-pay | Admitting: Family Medicine

## 2021-01-13 NOTE — Telephone Encounter (Signed)
Wendy Flores [id number], 70 year old, female    Calls today:  Refill    !! Before starting refill request, check EPIC to see if encounter for this medication already exists !!    diclofenac (VOLTAREN) 75 MG EC tablet [[id number]]    Order Details  Dose: 75 mg Route: Oral Frequency: 2 TIMES DAILY   Dispense Quantity: 60 tablet Refills: 1         Sig: Take 1 tablet by mouth 2 (two) times daily For 1 wk, then as needed.        Start Date: 12/17/20 End Date: 01/17/21   Written Date: 12/17/20 Expiration Date: --     Providers      Documented patient preferred pharmacies:    [id number] [name], [place] - [address]  Phone: [phone] Fax: [phone]    Person calling on behalf of patient: Pharmacy      Patient's language of care: [place] ([place])    Patient does not need an interpreter.    Patient's PCP: [name], MD

## 2021-01-13 NOTE — Telephone Encounter (Signed)
PER Pharmacy, Wendy Flores is a 70 year old female has requested a refill of      -  diclofenac        Last Office Visit: 12/17/20 with vogel r   Last Physical Exam: 12/16/17     There are no preventive care reminders to display for this patient.     Other Med Adult:  Most Recent BP Reading(s)  12/17/20 : 114/63        Cholesterol (mg/dL)   Date Value   49/66/4660 170     LOW DENSITY LIPOPROTEIN DIRECT (mg/dL)   Date Value   56/37/2942 80     HIGH DENSITY LIPOPROTEIN (mg/dL)   Date Value   62/70/0484 52     TRIGLYCERIDES (mg/dL)   Date Value   98/65/1686 184 (H)         THYROID SCREEN TSH REFLEX FT4 (uIU/mL)   Date Value   07/02/2020 1.800         No results found for: TSH    HEMOGLOBIN A1C (%)   Date Value   10/09/2020 6.1 (H)       No results found for: POCA1C      No results found for: INR    SODIUM (mmol/L)   Date Value   10/09/2020 142       POTASSIUM (mmol/L)   Date Value   10/09/2020 4.5           CREATININE (mg/dL)   Date Value   10/42/4731 0.7        Documented patient preferred pharmacies:    [facility] [name], [place] - [address]  Phone: [phone] Fax: [phone]

## 2021-01-15 ENCOUNTER — Other Ambulatory Visit (HOSPITAL_BASED_OUTPATIENT_CLINIC_OR_DEPARTMENT_OTHER): Payer: Self-pay | Admitting: Family Medicine

## 2021-01-15 NOTE — Telephone Encounter (Signed)
Per Pharmacy, Wendy Flores is a 70 year old female has requested a refill of DICLOFENAC TAB.      Last Office Visit: 12/17/2020 with VOGEL, Elvera Lennox  Last Physical Exam: 12/16/2017    There are no preventive care reminders to display for this patient.    Other Med Adult:  Most Recent BP Reading(s)  12/17/20 : 114/63        Cholesterol (mg/dL)   Date Value   43/83/6542 170     LOW DENSITY LIPOPROTEIN DIRECT (mg/dL)   Date Value   71/56/6483 80     HIGH DENSITY LIPOPROTEIN (mg/dL)   Date Value   02/10/198 52     TRIGLYCERIDES (mg/dL)   Date Value   24/15/5161 184 (H)         THYROID SCREEN TSH REFLEX FT4 (uIU/mL)   Date Value   07/02/2020 1.800         No results found for: TSH    HEMOGLOBIN A1C (%)   Date Value   10/09/2020 6.1 (H)       No results found for: POCA1C      No results found for: INR    SODIUM (mmol/L)   Date Value   10/09/2020 142       POTASSIUM (mmol/L)   Date Value   10/09/2020 4.5           CREATININE (mg/dL)   Date Value   44/32/4699 0.7       Documented patient preferred pharmacies:    [facility] - [name], [place] - [address]  Phone: [phone] Fax: [phone]

## 2021-01-15 NOTE — Telephone Encounter (Signed)
Stormy Connon [id number], 70 year old, female    Calls today:  Refill    !! Before starting refill request, check EPIC to see if encounter for this medication already exists !!    (May list multiple medications in this section)  Medicine Name: diclofenac (VOLTAREN) 75 MG EC tablet         Documented patient preferred pharmacies:    [facility] - [name], [place] - [address]  Phone: [phone] Fax: [phone]    Person calling on behalf of patient: Pharmacy      Patient's language of care: [place] ([place])    Patient does not need an interpreter.    Patient's PCP: [name], MD

## 2021-01-16 ENCOUNTER — Ambulatory Visit: Payer: 59 | Attending: Family Medicine | Admitting: Family Medicine

## 2021-01-16 DIAGNOSIS — M19041 Primary osteoarthritis, right hand: Secondary | ICD-10-CM | POA: Insufficient documentation

## 2021-01-16 MED ORDER — DICLOFENAC SODIUM 75 MG PO TBEC
75.00 mg | DELAYED_RELEASE_TABLET | Freq: Two times a day (BID) | ORAL | 1 refills | Status: AC
Start: 2021-01-16 — End: 2021-02-16

## 2021-01-16 NOTE — Progress Notes (Signed)
01/16/2021  PATIENT INFO: Wendy Flores  Interpreter: A [place] interpreter was used today via telephone.    SUBJECTIVE  HPI:   Patient Wendy Flores is a 70 year old female who presents for televisit for joint pain.    # R hand pain   - Had visit on 1/26 for pain in hand, sx thought due to OA  - Had ESR, CRP, anti CCP and RF which were negative   - Hand XR was suggestive of OA vs pseudogout   - Has rx for diclofenac 40m but has not started this, wanted to wait for test results   - Works in retail, folding t-shirts a lot which makes her pain worse     Review of Systems: As above.     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     Colon polyp     Diverticulosis of colon     Essential hypertension     Pure hypercholesterolemia     Current moderate episode of major depressive disorder without prior episode (HCC)     Tympanic membrane perforation, bilateral      Past Surgical History:  No date: MAMMAPLASTY AUGMENTATION W/PROSTHETIC IMPLANT; Bilateral  No date: OB ANTEPARTUM CARE CESAREAN DLVR & POSTPARTUM      Comment:  4  No date: SUPRACERVICAL ABDL HYSTER W/WO RMVL TUBE OVARY    Social History    Tobacco Use      Smoking status: Never Smoker      Smokeless tobacco: Never Used    Alcohol use: Yes      Comment: Occasionally        Current Outpatient Medications:     diclofenac (VOLTAREN) 75 MG EC tablet, Take 1 tablet by mouth 2 (two) times daily For 1 wk, then as needed., Disp: 60 tablet, Rfl: 1    famotidine (PEPCID) 20 MG tablet, Take 1 tablet by mouth 2 (two) times daily, Disp: 60 tablet, Rfl: 3    estradiol (ESTRACE) 0.1 MG/GM vaginal cream, 2 g nightly for 2 weeks, then 2g twice weekly for maintenance., Disp: 42.5 g, Rfl: 5    simvastatin (ZOCOR) 20 MG tablet, TAKE 1 TABLET BY MOUTH EVERY DAY AT NIGHT, Disp: 90 tablet, Rfl: 3    venlafaxine (EFFEXOR-XR) 150 MG 24 hr capsule, Take 1 capsule by mouth daily, Disp: 90 capsule, Rfl: 3     hydrochlorothiazide (MICROZIDE) 12.5 MG capsule, TAKE 1 CAPSULE BY MOUTH EVERY DAY, Disp: 90 capsule, Rfl: 3    Review of Patient's Allergies indicates:  No Known Allergies    Physical Exam:  No vital signs or physical exam- televisit. Patient speaking clearly in full sentences.     Assessment/Plan:       Wendy Teaneyis a 70year old [place]speaking female who presents for televisit for f/u hand pain.    1. Localized osteoarthritis of right hand  - Reviewed negative lab results, reviewed XR results. Likely OA. Advised diclofenac as prescribed to take BID with food x 1 wk, Arnica cream, ice/heat. Offered OT referral, pt would like to hold off on this for now. Pt questions answered.     NLaymond Purser PA-C

## 2021-03-27 ENCOUNTER — Ambulatory Visit (HOSPITAL_BASED_OUTPATIENT_CLINIC_OR_DEPARTMENT_OTHER): Payer: Self-pay | Admitting: Family Medicine

## 2021-03-27 ENCOUNTER — Ambulatory Visit (HOSPITAL_BASED_OUTPATIENT_CLINIC_OR_DEPARTMENT_OTHER): Payer: Self-pay

## 2021-04-03 ENCOUNTER — Ambulatory Visit (HOSPITAL_BASED_OUTPATIENT_CLINIC_OR_DEPARTMENT_OTHER): Payer: Self-pay | Admitting: Family Medicine

## 2021-04-08 ENCOUNTER — Encounter (HOSPITAL_BASED_OUTPATIENT_CLINIC_OR_DEPARTMENT_OTHER): Payer: Self-pay | Admitting: Family Medicine

## 2021-04-08 ENCOUNTER — Ambulatory Visit: Payer: 59 | Attending: Family Medicine | Admitting: Family Medicine

## 2021-04-08 ENCOUNTER — Other Ambulatory Visit: Payer: Self-pay

## 2021-04-08 VITALS — BP 118/66 | HR 91 | Temp 97.5°F | Ht 60.98 in | Wt 131.2 lb

## 2021-04-08 DIAGNOSIS — R42 Dizziness and giddiness: Secondary | ICD-10-CM | POA: Insufficient documentation

## 2021-04-08 DIAGNOSIS — E7800 Pure hypercholesterolemia, unspecified: Secondary | ICD-10-CM | POA: Diagnosis not present

## 2021-04-08 DIAGNOSIS — G3184 Mild cognitive impairment, so stated: Secondary | ICD-10-CM | POA: Diagnosis present

## 2021-04-08 DIAGNOSIS — F321 Major depressive disorder, single episode, moderate: Secondary | ICD-10-CM | POA: Diagnosis not present

## 2021-04-08 DIAGNOSIS — K219 Gastro-esophageal reflux disease without esophagitis: Secondary | ICD-10-CM | POA: Diagnosis present

## 2021-04-08 LAB — THYROID SCREEN TSH REFLEX FT4: THYROID SCREEN TSH REFLEX FT4: 5.43 u[IU]/mL — ABNORMAL HIGH (ref 0.270–4.200)

## 2021-04-08 LAB — VITAMIN B12: VITAMIN B12: 501 pg/mL (ref 232–1245)

## 2021-04-08 LAB — FOLATE: FOLATE: 14.8 ng/mL (ref 4.6–?)

## 2021-04-08 LAB — FREE THYROXINE: FREE THYROXINE: 0.94 ng/dL (ref 0.93–1.70)

## 2021-04-08 MED ORDER — SIMVASTATIN 20 MG PO TABS
ORAL_TABLET | ORAL | 3 refills | Status: AC
Start: 2021-04-08 — End: 2022-04-08

## 2021-04-08 MED ORDER — FAMOTIDINE 20 MG PO TABS
20.00 mg | ORAL_TABLET | Freq: Two times a day (BID) | ORAL | 3 refills | Status: AC
Start: 2021-04-08 — End: 2021-09-08

## 2021-04-08 MED ORDER — VENLAFAXINE HCL ER 150 MG PO CP24
150.00 mg | ORAL_CAPSULE | Freq: Every day | ORAL | 3 refills | Status: AC
Start: 2021-04-08 — End: 2021-11-06

## 2021-04-08 MED ORDER — HYDROCHLOROTHIAZIDE 12.5 MG PO CAPS
ORAL_CAPSULE | ORAL | 3 refills | Status: AC
Start: 2021-04-08 — End: 2021-11-06

## 2021-04-08 NOTE — Progress Notes (Signed)
CC: memory issues    #)memory concerns  -presents with daughter  -started about 4 years ago  -was evaluated and treated with venlafaxine a while ago  -current symptoms are: forgetting names of coworkers and boss (having trouble at work as well b/c of this); forgetting that oven is on, forgetting where car is parked so will not drive to some places  -forgetting to put on appropriate clothing when going out (got in [place] with slippers on)  -daughter notes some tremulousness at times  -pt endorses some trouble walking, esp getting starting but once moving it is not too bad  -pt endorses urinary sx as well    Additional history from daughter, pt lives with daughter now b/c daughter concerned about pt being alone. Pt has stopped driving entirely as well    #)dizziness  -intermittent, but better    #)GERD  -needs pepcid refill    #)lipids  -needs statin refill      ROS:    supplements:  -taking vit D  -maca powder      FH:  -mother with memory loss starting in 99s    SH:  -works at [employer]    O:  BP 118/66    Pulse 91    Temp 97.5 F (36.4 C) (Temporal)    Ht 5' 0.98" (1.549 m)    Wt 59.5 kg (131 lb 3.2 oz)    SpO2 95%    BMI 24.80 kg/m   Gen: NAD  HEENT: MMM  Pulm: normal effort  Psych: normal speech and thought; tearful at times  Skin: no rash  MSK: observed ambulating without difficulty  Neuro: no resting tremor noted  Minicog: word recall 3/3 immediate, 2/3 after  Clock draw was incorrect for ten minutes past 11. 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 High concern for dementia. Physical symptoms, plus urinary incontinence raise concern for NPH so ordering CT scan. Also checking TSH, floate/b12 for other obvious reversible cause.  If CT abnormal, will involve neurology to determine next steps (therapeutic LP vs other). If CT normal aside from atrophy will start aricept 5mg  qHS  Plan: THYROID SCREEN TSH  REFLEX FT4, VITAMIN B12,         FOLATE, CT HEAD WO CONTRAST            (F32.1) Current moderate episode of major depressive disorder without prior episode (HCC)  Comment: refilled venlafaxine  Plan: venlafaxine (EFFEXOR-XR) 150 MG 24 hr capsule            (E78.00) High cholesterol  Comment:   Plan: simvastatin (ZOCOR) 20 MG tablet            (R42) Dizziness  Comment: overall better at this time  Plan: hydrochlorothiazide (MICROZIDE) 12.5 MG capsule            (K21.9) Gastroesophageal reflux disease, unspecified whether esophagitis present  Comment: refilled pepcid  Plan: famotidine (PEPCID) 20 MG tablet          I spent a total of 50 minutes on this visit on the date of service (total time includes all activities performed on the date of service)

## 2021-04-10 ENCOUNTER — Other Ambulatory Visit: Payer: Self-pay

## 2021-04-10 ENCOUNTER — Encounter (HOSPITAL_BASED_OUTPATIENT_CLINIC_OR_DEPARTMENT_OTHER): Payer: Self-pay

## 2021-04-10 ENCOUNTER — Ambulatory Visit
Admission: RE | Admit: 2021-04-10 | Discharge: 2021-04-10 | Disposition: A | Discharge status: Home / Self Care | Payer: 59 | Attending: Family Medicine | Admitting: Family Medicine

## 2021-04-10 DIAGNOSIS — Z1231 Encounter for screening mammogram for malignant neoplasm of breast: Secondary | ICD-10-CM | POA: Insufficient documentation

## 2021-04-10 DIAGNOSIS — Z1239 Encounter for other screening for malignant neoplasm of breast: Secondary | ICD-10-CM

## 2021-04-13 ENCOUNTER — Other Ambulatory Visit (HOSPITAL_BASED_OUTPATIENT_CLINIC_OR_DEPARTMENT_OTHER): Payer: Self-pay | Admitting: Family Medicine

## 2021-04-13 DIAGNOSIS — B009 Herpesviral infection, unspecified: Secondary | ICD-10-CM

## 2021-04-13 NOTE — Telephone Encounter (Signed)
Chisa Kushner [id number], 70 year old, female    Calls today:  Refill    !! Before starting refill request, check EPIC to see if encounter for this medication already exists !!    (May list multiple medications in this section)  Medicine Name: valACYclovir HCl (VALTREX) 1 g tablet [[id number]] ENDED   Order Details  Dose, Route, Frequency: As Directed   Dispense Quantity: 60 tablet Refills: 1         Sig: TAKE ONE TABLET DAILY FOR 5 DAYS AT FIRST SIGN OF HERPES OUTBREAK     Documented patient preferred pharmacies:    [facility] - [name], [place] - [address]  Phone: [phone] Fax: [phone]    Person calling on behalf of patient: Pharmacy    Patient's language of care: [place] ([place])    Patient needs a [place] interpreter.    Patient's PCP: [name], MD

## 2021-04-13 NOTE — Telephone Encounter (Signed)
PER Patient (self), Wendy Flores is a 70 year old female has requested a refill of valtrex.      Last Office Visit: 04/08/2021 with stricksek,j  Last Physical Exam: n/a  COLONOSCOPY due on 05/02/2021    Other Med Adult:  Most Recent BP Reading(s)  04/08/21 : 118/66        Cholesterol (mg/dL)   Date Value   16/61/9694 170     LOW DENSITY LIPOPROTEIN DIRECT (mg/dL)   Date Value   09/82/8675 80     HIGH DENSITY LIPOPROTEIN (mg/dL)   Date Value   19/82/4299 52     TRIGLYCERIDES (mg/dL)   Date Value   80/69/9967 184 (H)         THYROID SCREEN TSH REFLEX FT4 (uIU/mL)   Date Value   04/08/2021 5.430 (H)         No results found for: TSH    HEMOGLOBIN A1C (%)   Date Value   10/09/2020 6.1 (H)       No results found for: POCA1C      No results found for: INR    SODIUM (mmol/L)   Date Value   10/09/2020 142       POTASSIUM (mmol/L)   Date Value   10/09/2020 4.5           CREATININE (mg/dL)   Date Value   22/77/3750 0.7       Documented patient preferred pharmacies:    [facility] - [name], [place] - [address]  Phone: [phone] Fax: [phone]

## 2021-04-14 MED ORDER — VALACYCLOVIR HCL 1 G PO TABS
ORAL_TABLET | ORAL | 1 refills | Status: DC
Start: 2021-04-14 — End: 2021-04-23

## 2021-04-17 ENCOUNTER — Telehealth (HOSPITAL_BASED_OUTPATIENT_CLINIC_OR_DEPARTMENT_OTHER): Payer: Self-pay | Admitting: Family Medicine

## 2021-04-17 NOTE — Telephone Encounter (Signed)
Patient calling requesting script for vertigo/labyrinthitis, please review and send script if appropriate      [facility] - [name], [place] - [address]  Phone: [phone] Fax: [phone]

## 2021-04-17 NOTE — Telephone Encounter (Signed)
Outreach call made to patient regarding request for labyrinthitis.  Patient endorses having this dx for a long time and was recently treated for it in [place]. She is requesting med refill.  At present, patient is tearful, endorses dizziness, nausea, tiredness.  I discussed with patient about not having dx medication name on her records.  Encouraged to seek UC/ED in [place] and f/u with PCP for s/p ED visit.  Patient has agreed with plan and wil ask daughter to taker to nearest ED.

## 2021-04-22 ENCOUNTER — Other Ambulatory Visit (HOSPITAL_BASED_OUTPATIENT_CLINIC_OR_DEPARTMENT_OTHER): Payer: Self-pay

## 2021-04-23 ENCOUNTER — Other Ambulatory Visit (HOSPITAL_BASED_OUTPATIENT_CLINIC_OR_DEPARTMENT_OTHER): Payer: Self-pay | Admitting: Family Medicine

## 2021-04-23 DIAGNOSIS — B009 Herpesviral infection, unspecified: Secondary | ICD-10-CM

## 2021-04-23 MED ORDER — VALACYCLOVIR HCL 1 G PO TABS
ORAL_TABLET | ORAL | 1 refills | Status: AC
Start: 2021-04-23 — End: 2021-06-22

## 2021-05-27 ENCOUNTER — Other Ambulatory Visit (HOSPITAL_BASED_OUTPATIENT_CLINIC_OR_DEPARTMENT_OTHER): Payer: Self-pay

## 2021-05-28 ENCOUNTER — Other Ambulatory Visit (HOSPITAL_BASED_OUTPATIENT_CLINIC_OR_DEPARTMENT_OTHER): Payer: Self-pay | Admitting: Family Medicine

## 2021-05-28 MED ORDER — DONEPEZIL HCL 5 MG PO TABS
5.00 mg | ORAL_TABLET | Freq: Every evening | ORAL | 1 refills | Status: AC
Start: 2021-05-28 — End: 2021-06-28

## 2021-05-28 NOTE — Telephone Encounter (Signed)
Referral dept has cancelled CT scan for the second time.      Will move forward with dementia treatment at this time to avoid delaying any longer.  Will start aricept 5mg  nightly. Plan to increase to 10mg  nightly after one month if incomplete response.  I will most likely be out on leave at that time, please schedule follow up with team provider.    Once medication update confirmed with pt, will send to pharmacy

## 2021-05-28 NOTE — Telephone Encounter (Signed)
Patient telephone out of service.    Spoke with the patient daughter  She verbalizes that the patient spouse in [place], will be having  Surgery soon, "she went to be with him and will not be back in the [place] until the end of November 2022>    Daughter states ok to send [facility] to [facility]  She will send to [place]    Daughter asked that we use her telephone number for contact

## 2022-10-13 ENCOUNTER — Other Ambulatory Visit: Payer: Self-pay | Admitting: Family Medicine

## 2024-02-16 ENCOUNTER — Other Ambulatory Visit: Payer: Self-pay | Admitting: Family Medicine

## 2024-05-31 IMAGING — MR RM CRANIO
6 of 9 series · 32 of 48 positions shown · non-contrast
Comparison: none

[Series 2: T1 · sagittal · 6.0mm · 0.47mm/px · 2 of 20 slices shown]
[im 1/20]
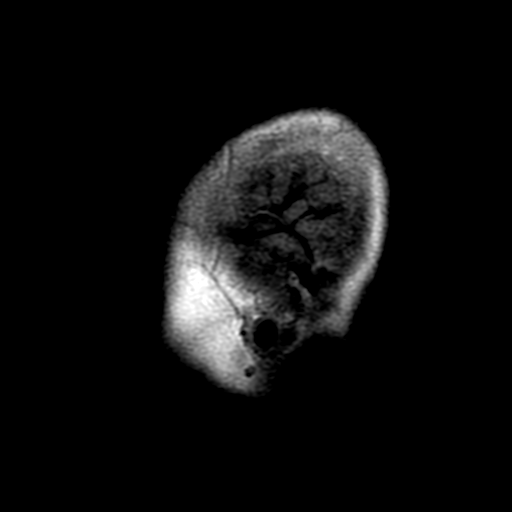
[im 4/20]
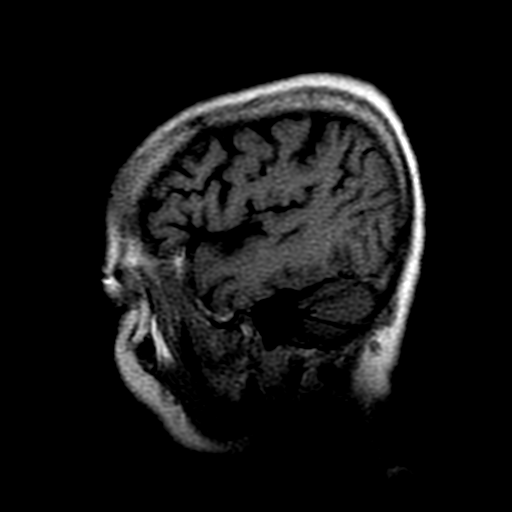

[Series 3: FLAIR · axial · 6.0mm · 1.02mm/px · z∈[-63,+63]mm · 6 of 20 slices shown (1 of 2)]
[im 1/20]
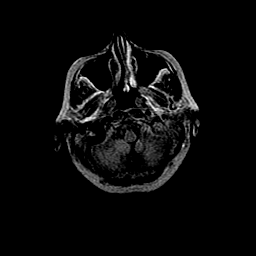
[im 4/20]
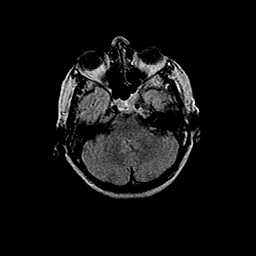
[im 8/20]
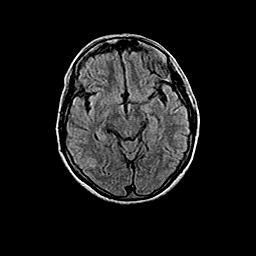
[im 12/20]
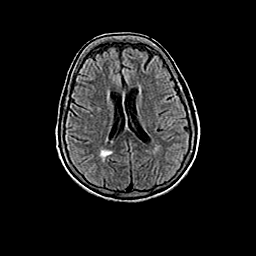
[im 16/20]
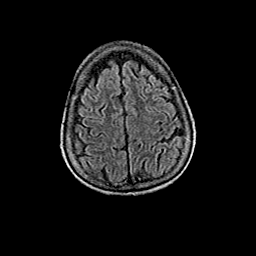
[im 20/20]
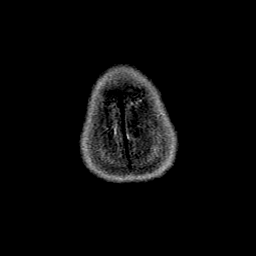

[Series 4: GRE · axial · 6.0mm · 1.02mm/px · z∈[-63,+63]mm · 6 of 20 slices shown]
[im 1/20]
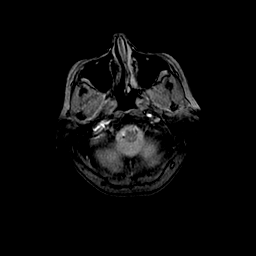
[im 4/20]
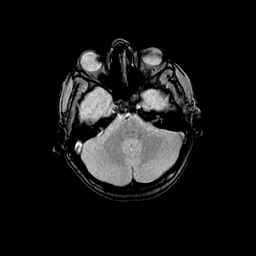
[im 8/20]
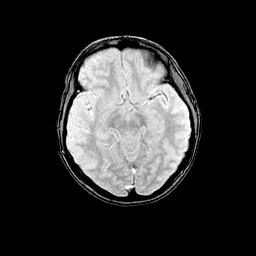
[im 12/20]
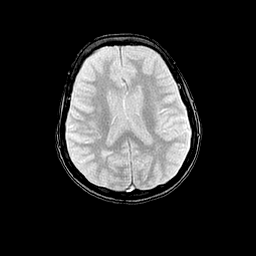
[im 16/20]
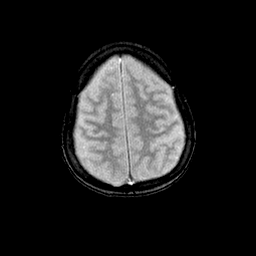
[im 20/20]
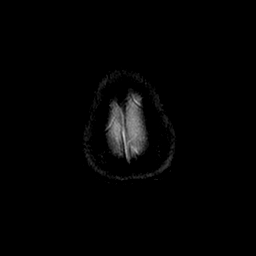

[Series 5: T2 · axial · 6.0mm · 0.51mm/px · z∈[-63,+63]mm · 6 of 20 slices shown (1 of 2)]
[im 1/20]
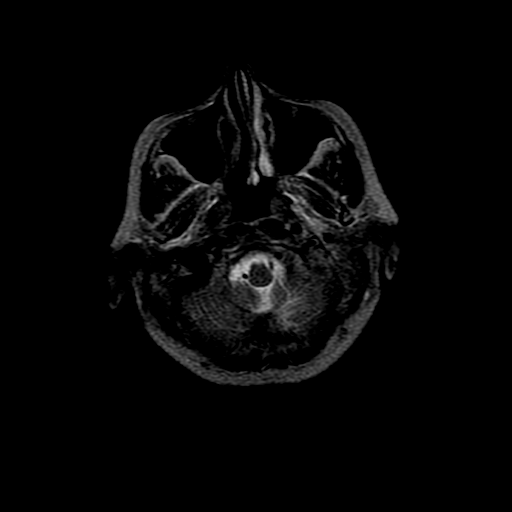
[im 4/20]
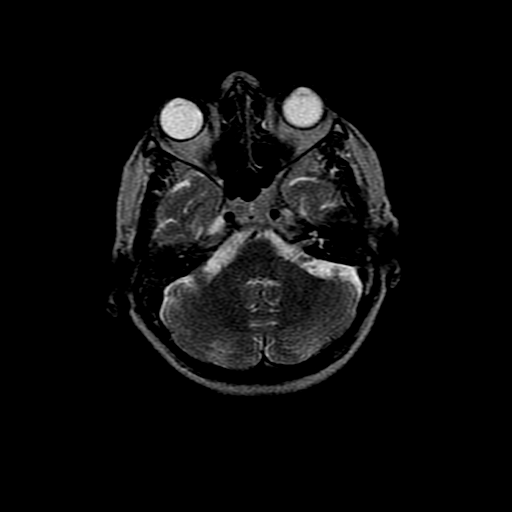
[im 8/20]
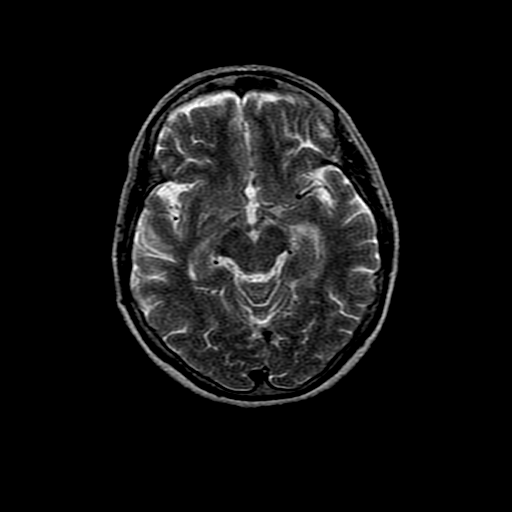
[im 12/20]
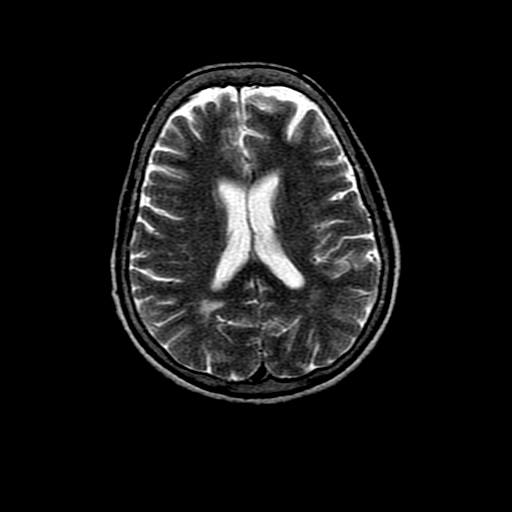
[im 16/20]
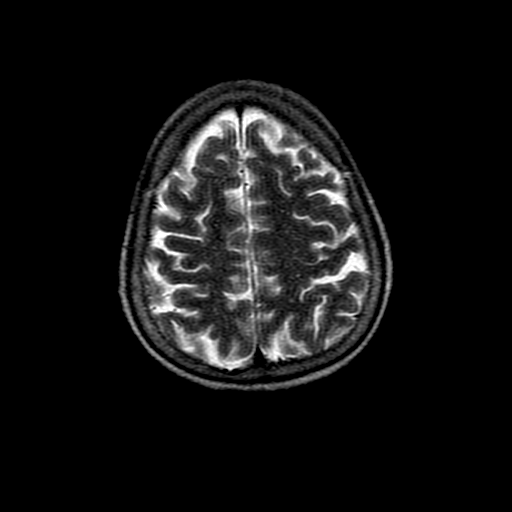
[im 20/20]
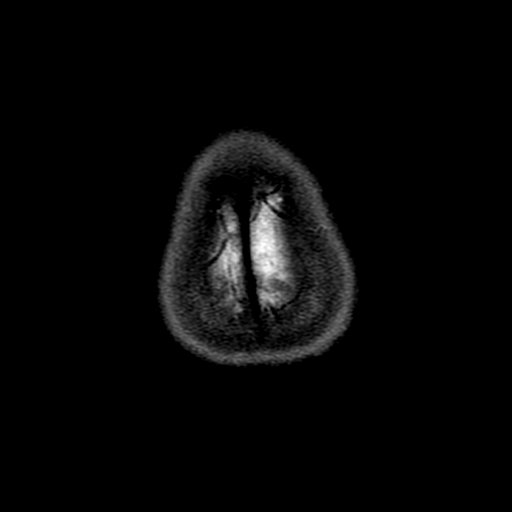

[Series 6: T2 · coronal · 6.0mm · 0.51mm/px · 6 of 20 slices shown (2 of 2)]
[im 1/20]
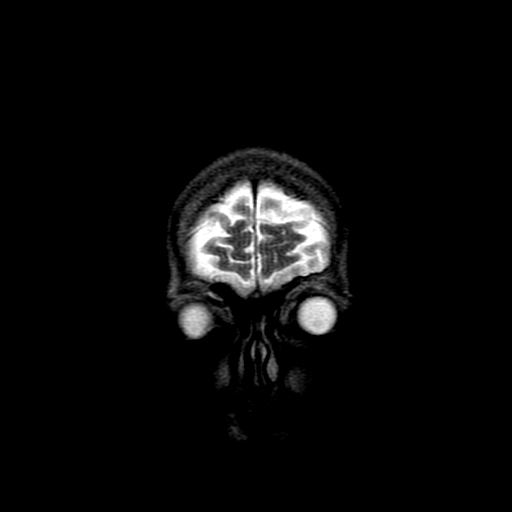
[im 4/20]
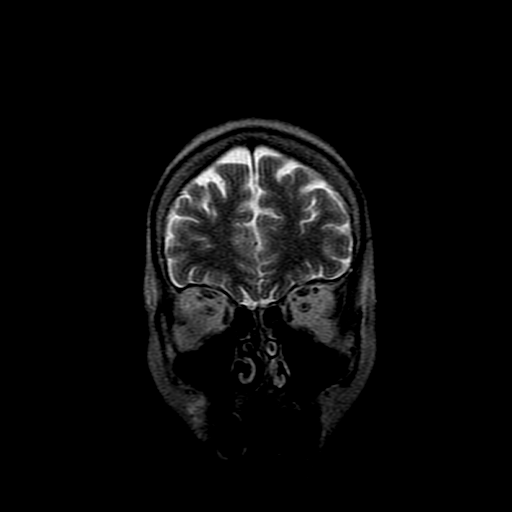
[im 8/20]
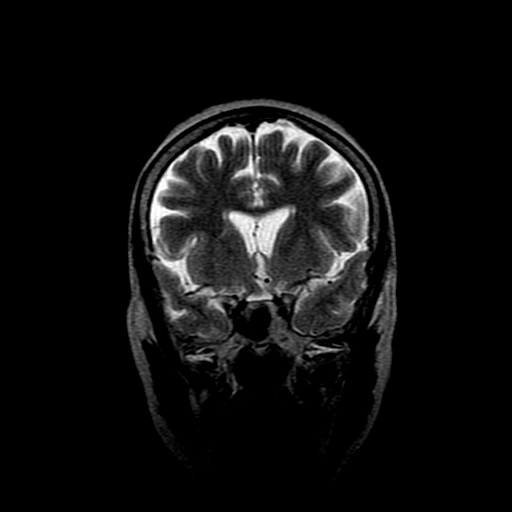
[im 12/20]
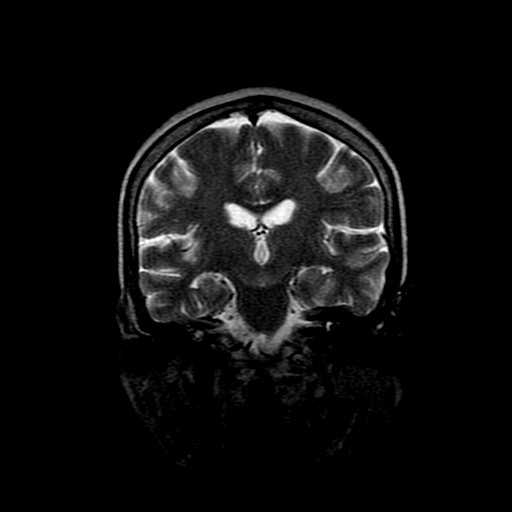
[im 16/20]
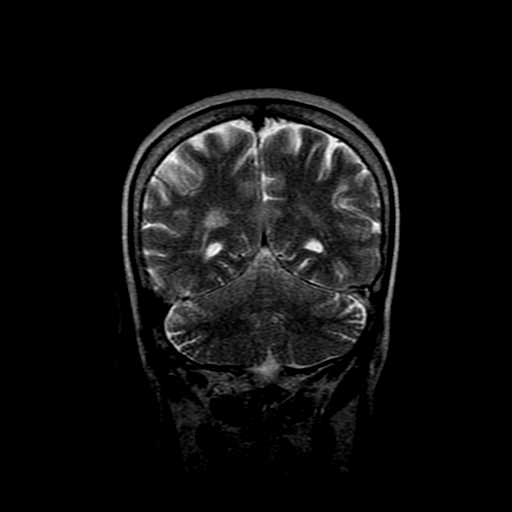
[im 20/20]
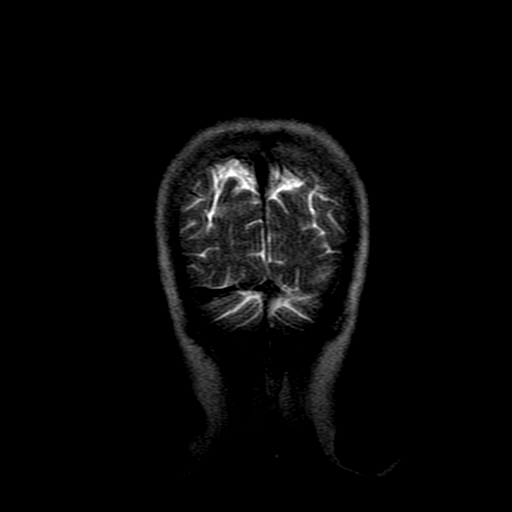

[Series 9: FLAIR · coronal · 3.5mm · 1.05mm/px · 6 of 20 slices shown (2 of 2)]
[im 1/20]
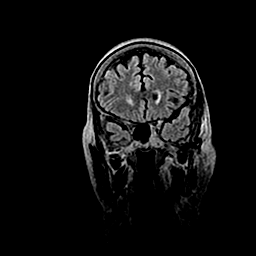
[im 4/20]
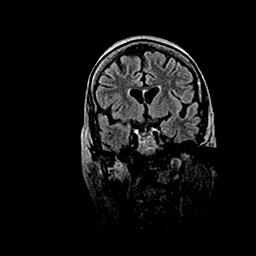
[im 8/20]
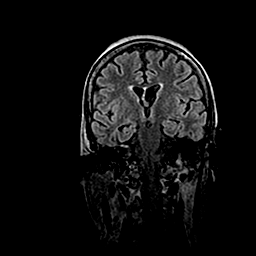
[im 12/20]
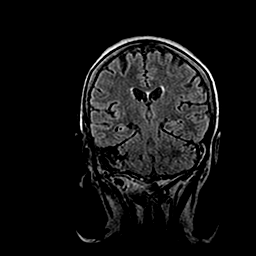
[im 16/20]
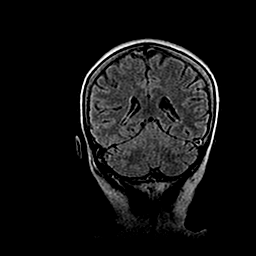
[im 20/20]
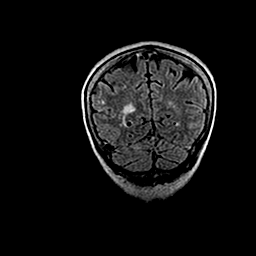

[32 of 48 positions shown; findings below may reference images not displayed]

RESSONÂNCIA MAGNÉTICA DO CRÂNIO

TÉCNICA:
Exame realizado em equipamento de ressonância magnética com sequências, ponderações e planos específicos para o segmento de interesse, sem a administração endovenosa do meio de contraste.

RESULTADO:
Redução volumétrica encefálica difusa com dilatação compensatória do espaço subaracnoideo.
Focos de hipersinal em T2 distribuídos pela substância branca supratentorial, sem determinar efeito atrófico ou expansivo significativo, sugerindo gliose por microangiopatia.
Hipocampos de dimensões, morfologia e sinal preservados.
O restante do parênquima cerebral tem intensidade de sinal normal.
Ausência de hidrocefalia hipertensiva.
Tronco cerebral e cerebelo apresentando forma e intensidade de sinal normal.
Não há sinais de processo expansivo intracraniano.
Corpo caloso de morfologia e espessuras normais.
A sequência eco-planar não evidencia focos de restrição à difusão passiva de água.

CONCLUSÃO:
Redução volumétrica encefálica difusa, habitual para a faixa etária.
Provável gliose por microangiopatia leve na substância branca supratentorial ([place] [address]).
